# Patient Record
Sex: Female | Born: 1995 | Race: Black or African American | Hispanic: No | Marital: Single | Smoking: Former smoker
Health system: Southern US, Community
[De-identification: ages and names within clinical notes are randomized; demographics above are authoritative.]

## PROBLEM LIST (undated history)

## (undated) DIAGNOSIS — N200 Calculus of kidney: Secondary | ICD-10-CM

## (undated) DIAGNOSIS — F329 Major depressive disorder, single episode, unspecified: Secondary | ICD-10-CM

## (undated) DIAGNOSIS — N63 Unspecified lump in unspecified breast: Secondary | ICD-10-CM

## (undated) DIAGNOSIS — F32A Depression, unspecified: Secondary | ICD-10-CM

## (undated) DIAGNOSIS — F419 Anxiety disorder, unspecified: Secondary | ICD-10-CM

## (undated) HISTORY — PX: WISDOM TOOTH EXTRACTION: SHX21

## (undated) HISTORY — PX: EYE SURGERY: SHX253

## (undated) HISTORY — PX: INDUCED ABORTION: SHX677

## (undated) HISTORY — PX: REFRACTIVE SURGERY: SHX103

---
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
## 2014-01-30 ENCOUNTER — Emergency Department (HOSPITAL_COMMUNITY)
Admission: EM | Admit: 2014-01-30 | Discharge: 2014-01-30 | Disposition: A | Discharge status: Home / Self Care | Payer: Medicaid Other | Attending: Emergency Medicine | Admitting: Emergency Medicine

## 2014-01-30 ENCOUNTER — Encounter (HOSPITAL_COMMUNITY): Payer: Self-pay | Admitting: Emergency Medicine

## 2014-01-30 DIAGNOSIS — F17200 Nicotine dependence, unspecified, uncomplicated: Secondary | ICD-10-CM | POA: Insufficient documentation

## 2014-01-30 DIAGNOSIS — K0889 Other specified disorders of teeth and supporting structures: Secondary | ICD-10-CM

## 2014-01-30 DIAGNOSIS — K089 Disorder of teeth and supporting structures, unspecified: Secondary | ICD-10-CM | POA: Insufficient documentation

## 2014-01-30 DIAGNOSIS — R609 Edema, unspecified: Secondary | ICD-10-CM | POA: Insufficient documentation

## 2014-01-30 MED ORDER — PENICILLIN V POTASSIUM 500 MG PO TABS: 500.0000 mg | ORAL_TABLET | Freq: Four times a day (QID) | ORAL | Status: AC

## 2014-01-30 MED ORDER — IBUPROFEN 800 MG PO TABS: 800.0000 mg | ORAL_TABLET | Freq: Three times a day (TID) | ORAL | Status: DC | PRN

## 2014-01-30 MED ORDER — HYDROCODONE-ACETAMINOPHEN 5-325 MG PO TABS: 1.0000 | ORAL_TABLET | ORAL | Status: DC | PRN

## 2014-01-30 NOTE — Discharge Instructions (Signed)
Read the information below.  Use the prescribed medication as directed.  Do not take additional tylenol while taking the prescribed Vicodin. Please discuss all new medications with your pharmacist.  You may return to the Emergency Department at any time for worsening condition or any new symptoms that concern you.  Please call the dentist listed above within 48 hours to schedule a close follow up appointment.  If you develop fevers, swelling in your face, difficulty swallowing or breathing, return to the ER immediately for a recheck.    Dental Pain A tooth ache may be caused by cavities (tooth decay). Cavities expose the nerve of the tooth to air and hot or cold temperatures. It may come from an infection or abscess (also called a boil or furuncle) around your tooth. It is also often caused by dental caries (tooth decay). This causes the pain you are having. DIAGNOSIS  Your caregiver can diagnose this problem by exam. TREATMENT   If caused by an infection, it may be treated with medications which kill germs (antibiotics) and pain medications as prescribed by your caregiver. Take medications as directed.  Only take over-the-counter or prescription medicines for pain, discomfort, or fever as directed by your caregiver.  Whether the tooth ache today is caused by infection or dental disease, you should see your dentist as soon as possible for further care. SEEK MEDICAL CARE IF: The exam and treatment you received today has been provided on an emergency basis only. This is not a substitute for complete medical or dental care. If your problem worsens or new problems (symptoms) appear, and you are unable to meet with your dentist, call or return to this location. SEEK IMMEDIATE MEDICAL CARE IF:   You have a fever.  You develop redness and swelling of your face, jaw, or neck.  You are unable to open your mouth.  You have severe pain uncontrolled by pain medicine. MAKE SURE YOU:   Understand these  instructions.  Will watch your condition.  Will get help right away if you are not doing well or get worse. Document Released: 08/13/2005 Document Revised: 11/05/2011 Document Reviewed: 03/31/2008 [facility] Patient Information 2014 [place], [place].    Emergency Department Resource Guide 1) Find a Doctor and Pay Out of Pocket Although you won't have to find out who is covered by your insurance plan, it is a good idea to ask around and get recommendations. You will then need to call the office and see if the doctor you have chosen will accept you as a new patient and what types of options they offer for patients who are self-pay. Some doctors offer discounts or will set up payment plans for their patients who do not have insurance, but you will need to ask so you aren't surprised when you get to your appointment.  2) Contact Your Local Health Department Not all health departments have doctors that can see patients for sick visits, but many do, so it is worth a call to see if yours does. If you don't know where your local health department is, you can check in your phone book. The CDC also has a tool to help you locate your state's health department, and many state websites also have listings of all of their local health departments.  3) Find a Walk-in Clinic If your illness is not likely to be very severe or complicated, you may want to try a walk in clinic. These are popping up all over the country in pharmacies, drugstores, and  shopping centers. They're usually staffed by nurse practitioners or physician assistants that have been trained to treat common illnesses and complaints. They're usually fairly quick and inexpensive. However, if you have serious medical issues or chronic medical problems, these are probably not your best option.  No Primary Care Doctor: - Call Health Connect at  [phone] - they can help you locate a primary care doctor that  accepts your insurance, provides certain  services, etc. - Physician Referral Service- [phone]  Chronic Pain Problems: Organization         Address  Phone   Notes  [facility]  [phone] Patients need to be referred by their primary care doctor.   Medication Assistance: Organization         Address  Phone   Notes  [facility] Medication [facility] [address] [place] [address] [place], [place] [address] [phone] --Must be a resident of [facility] -- Must have NO insurance coverage whatsoever (no Medicaid/ Medicare, etc.) -- The pt. MUST have a primary care doctor that directs their care regularly and follows them in the community   MedAssist  [phone]   [employer]  [phone]    Agencies that provide inexpensive medical care: Organization         Address  Phone   Notes  [facility]  [phone]   [facility]    [phone]   [facility] [address] [place], [place] [address] [phone]   [facility] [address] [place] [phone]   Planned Parenthood    [phone]   [facility]    [phone]   Community Health and [facility]  [address], [place] Phone:  [phone], Fax:  [phone] Hours of Operation:  9 am - 6 pm, M-F.  Also accepts Medicaid/Medicare and self-pay.  [facility]  [address], [place] Phone: [phone], Fax: [phone]. Hours of Operation:  8:30 am - 5:30 pm, M-F.  Also accepts Medicaid and self-pay.  [facility] [place] [address] [place] Phone: [phone]   [facility] [address] Natasha Bence [place], [place] [phone], Ext. [phone] Mondays & Thursdays: 7-9 AM.  First 15 patients are seen on a first come, first serve basis.    Medicaid-accepting [facility] Providers:  Organization         Address  Phone   Notes  [facility] [address], [place] [phone] Also accepts self-pay patients.  [facility] [address] Laurell Josephs [place], [place]  [phone]   [facility] [address], [place] [phone]   [facility] [address] [place] [phone]   Renaye Rakers [address], [place]   [phone] Only accepts [place] patients after they have their name applied to their card.   Self-Pay (no insurance) in [facility]:  Organization         Address  Phone   Notes  Sickle Cell Patients, [facility] [address] [place], [place] [phone]   [facility] [address] [place], [place] [phone]   [facility] [place]  [address], [place] [phone]  [facility]/Dr. Osei-Bonsu  [address] [place] [address], [place] or [address], [place] [phone] Phone number for both [place] and [place] locations is the same.  [facility] [address], [place] [phone]) [phone]   [facility] [address] [place] [address], [place] or [address] [phone]) [phone]   [facility] [address], [place] [phone], phone; [phone], fax Sees patients 1st and 3rd Saturday of every month.  Must not qualify for public or private insurance (i.e. Medicaid, Medicare, [place] Health Choice, Veterans' Benefits)  Household income should be no more than 200% of the poverty level The clinic cannot treat you if you are pregnant or think you are pregnant  Sexually transmitted diseases are not treated at the clinic.    Dental Care: Organization         Address  Phone  Notes  [facility] [address], [place] [phone] Accepts  children up to age 19 who are enrolled in [place]icaid or [place] Health Choice; pregnant women with a Medicaid card; and children who have applied for Medicaid or [place] Health Choice, but were declined, whose parents can pay a reduced fee at time of service.  [facility] [place]  [address], [place] [phone] Accepts children up to age 19 who are enrolled in [place] or [place] Health Choice; pregnant women with a Medicaid card; and children who have applied for Medicaid or [place] Health Choice, but were declined, whose parents can pay a reduced fee at time of service.  [facility]  [address], [place] [phone]) [phone] Patients are seen by appointment only. Walk-ins are not accepted. [facility] will see patients 19 years of age and older. Monday - Tuesday (8am-5pm) Most Wednesdays (8:30-5pm) $30 per visit, cash only  [facility]  [address], [facility] [phone] Patients are seen by appointment only. Walk-ins are not accepted. [facility] will see patients 19 years of age and older. One Wednesday Evening (Monthly: Volunteer Based).  $30 per visit, cash only  [employer] [facility]  [phone] for adults; Children under age 704, call Graduate Pediatric Dentistry at [phone]. Children aged 574-14, please call [phone] to request a pediatric application.  Dental services are provided in all areas of dental care including fillings, crowns and bridges, complete and partial dentures, implants, gum treatment, root canals, and extractions. Preventive care is also provided. Treatment is provided to both adults and children. Patients are selected via a lottery and there is often a waiting list.   [facility] [address], [place]  [phone]) [phone] [email or web address]   [facility] [address], [place], [place] [phone], Ext. [phone] Second and  Fourth Thursday of each month, opens at 6:30 AM; Clinic ends at 9 AM.  Patients are seen on a first-come first-served basis, and a limited number are seen during each clinic.   [facility]  [address], [place], [place] [phone]) [phone]   Eligibility Requirements You must have lived in [place], [place], or [place] counties for at least the last three months.   You cannot be eligible for state or federal sponsored National Cityhealthcare insurance, including CIGNAVeterans Administration, [place]icaid, or [employer]Medicare.   You generally cannot be eligible for healthcare insurance  through your employer.    How to apply: Eligibility screenings are held every Tuesday and Wednesday afternoon from 1:00 pm until 4:00 pm. You do not need an appointment for the interview!  [facility] [address], [place], [place] [phone]   [facility]  [phone]   [facility]  [phone]   [facility]  [phone]    Behavioral Health Resources in the Community: Intensive Outpatient Programs Organization         Address  Phone  Notes  [facility] [address], [place], [place] [phone]   [facility] [address], [place], [place] [phone]   [facility] [address], [place], [place]  [phone]   [facility] [address],  [place], [place] [phone] or [phone]   Substance Abuse Resources Organization         Address  Phone  Notes  [facility]  [phone]   [facility]  [phone]   [facility]  [phone]   [facility]  [phone]   Residential & Outpatient Substance Abuse Program  [phone]   Psychological Services Organization         Address  Phone  Notes  [facility]  [phone]- [phone]   [facility]  [phone]- [phone]   [facility]  [address], [place] [phone] or [phone]    Mobile Crisis Teams Organization         Address  Phone  Notes  [facility], Mobile Crisis Care Unit  [phone]   [facility]  [address] [place], [place] [phone]   Doristine LocksSharon DeEsch [address] [place] [phone]    Self-Help/Support Groups Organization         Address  Phone             Notes  [facility] [place] - variety of support groups  336- [phone] Call for more information  Narcotics Anonymous (NA), [facility] [address], [place]  2 meetings at this location   Statisticianesidential Treatment Programs Organization         Address  Phone  Notes  [facility] [address],    [place]  [phone]   [facility]  [address], [place] [phone], [place], [place] [phone]   [facility] [address], [place] [phone] Admissions: 8am-3pm M-F  [facility] [address]    [place], [place] [phone]   [facility] [address], [place], [place] [phone]   [facility] [address]  [place], [place] [phone]   [facility] - Intensive Outpatient [address], [place], [place] [phone]   [facility] ([facility] [address]  [place], [place] [phone] or [phone]   [facility] ([facility]) [address] [place], [place] [phone] Accepts Medicaid  [facility] [address]  [place] [phone] Substance Abuse/Addiction Treatment   [facility] Resources Organization         Address  Phone  Notes  [facility]  [phone]   Angie FavaJulie Brannon, PhD [address] [place], [place]   [phone] or [phone]) [phone]   [facility]   [address] [place], [place] [phone])  [phone]  [facility] [address], [place], [place] [phone] Insurance/Medicaid/sponsorship through [facility]  °[facility] [address]                                    [place], [place] [phone] Therapy/tele-psych/case  °[facility] [address]  ° [place], [place] [phone]    °Dr. Arfeen  [phone]   °[facility] [place]  [employer] [place] [facility] 1) [address], [place] °2) [address], [place] °3)  [address], [place] [phone] °([phone] ° °([phone]   °[place] Child Abuse Hotline (336) 342-1394 or (336) [phone] (After Hours)    ° ° ° °

## 2014-01-30 NOTE — ED Notes (Signed)
PT reports mouth swelling started on WED. And has increased in size. Pt denies any tooth injury.

## 2014-01-30 NOTE — ED Provider Notes (Signed)
Medical screening examination/treatment/procedure(s) were performed by non-physician practitioner and as supervising physician I was immediately available for consultation/collaboration.     Suzi Roots, MD 01/30/14 [phone]

## 2014-01-30 NOTE — ED Notes (Signed)
PT declined W/C at time of D/C the patient escorted to lobby by RN.

## 2014-01-30 NOTE — ED Provider Notes (Signed)
CSN: [id number]     Arrival date & time 01/30/14  0840 History  This chart was scribed for Clayton Bibles PA-C working with Mirna Mires, MD by Stacy Gardner, ED scribe. This patient was seen in room TR07C/TR07C and the patient's care was started at 9:17 AM.   First MD Initiated Contact with Patient 01/30/14 0845     Chief Complaint  Patient presents with  . Mouth Lesions     (Consider location/radiation/quality/duration/timing/severity/associated sxs/prior Treatment) Patient is a 18 y.o. female presenting with tooth pain. The history is provided by the patient and medical records. No language interpreter was used.  Dental Pain Location:  Upper Quality:  Sharp and constant Severity:  Severe Onset quality:  Gradual Duration:  3 days Timing:  Constant Progression:  Worsening Worsened by:  Cold food/drink and hot food/drink Associated symptoms: facial swelling   Associated symptoms: no fever    HPI Comments: Samantha Carroll is a 18 y.o. female who presents to the Emergency Department complaining of constant, severe lower right jaw pain onset three days ago that is getting progressively worse. The pain is 9/10 in severity. She describes the pain as stabbing and aching.  The pain is worse with eating, drinking and swallowing. She has tried [facility] powder and ibuprofen without any improvement of her symptoms. Pt reports having mild facial swelling.  Denies fever, ear pain, and difficulty swallowing.  Denies trauma and injury. Pt speculates the presence of a cavity or her wisdom tooth.  She currently smokes cigarettes everyday.    Pt does not have a regular dentist.   History reviewed. No pertinent past medical history. History reviewed. No pertinent past surgical history. History reviewed. No pertinent family history. History  Substance Use Topics  . Smoking status: Current Every Day Smoker    Types: Cigarettes  . Smokeless tobacco: Never Used  . Alcohol Use: No   OB History   Grav  Para Term Preterm Abortions TAB SAB Ect Mult Living                 Review of Systems  Constitutional: Negative for fever.  HENT: Positive for dental problem and facial swelling. Negative for ear pain and trouble swallowing.        Right jaw pain  All other systems reviewed and are negative.     Allergies  Review of patient's allergies indicates no known allergies.  Home Medications   Prior to Admission medications   Medication Sig Start Date End Date Taking? Authorizing Provider  ibuprofen (ADVIL,MOTRIN) 600 MG tablet Take 600 mg by mouth every 6 (six) hours as needed.      Historical Provider, MD  oseltamivir (TAMIFLU) 75 MG capsule Take 1 capsule (75 mg total) by mouth every 12 (twelve) hours. 08/11/12   Billy Fischer, MD  PredniSONE 5 MG KIT Take 1 kit (5 mg total) by mouth daily after breakfast. Prednisone 5 mg 6 day dosepack.  Take as directed. 07/09/12   Harden Mo, MD  triamcinolone cream (KENALOG) 0.1 % Apply topically 3 (three) times daily. 07/09/12   Harden Mo, MD   BP 117/72  Pulse 85  Temp(Src) 98.1 F (36.7 C) (Oral)  Resp 18  Ht 5' 8"  (1.727 m)  Wt 157 lb 5 oz (71.356 kg)  BMI 23.92 kg/m2  SpO2 99%  LMP 01/30/2014 Physical Exam  Nursing note and vitals reviewed. Constitutional: She appears well-developed and well-nourished. No distress.  HENT:  Head: Normocephalic and atraumatic.  Mouth/Throat:  Oropharynx is clear and moist and mucous membranes are normal. No posterior oropharyngeal edema or posterior oropharyngeal erythema.  Right lower third molar is tender to percussion. White plaque surrounding tooth that is tender to palpation.  Mild right-sided facial fullness and tenderness to palpation.    Neck: Neck supple.  No paratracheal tenderness.   Cardiovascular: Normal rate, regular rhythm and normal heart sounds.  Exam reveals no gallop and no friction rub.   No murmur heard. Pulmonary/Chest: Effort normal and breath sounds normal. She has no  wheezes. She has no rales.  Lymphadenopathy:    She has no cervical adenopathy.  Neurological: She is alert.  Skin: Skin is warm and dry. She is not diaphoretic.    ED Course  Procedures (including critical care time) DIAGNOSTIC STUDIES: Oxygen Saturation is 99% on room air, normal by my interpretation.    COORDINATION OF CARE:  9:20 AM Discussed course of care with pt which includes antibiotics. Will give pt a referral to a dentist. Advised pt to follow up with a dentist.Pt understands and agrees.      Labs Review Labs Reviewed - No data to display  Imaging Review No results found.   EKG Interpretation None      MDM   Final diagnoses:  Pain, dental    Afebrile, nontoxic patient with new dental pain.  No obvious abscess.  No concerning findings on exam.  Doubt deep space head or neck infection.  Doubt Ludwig's angina.  D/C home with antibiotic, pain medication and dental follow up.  Discussed findings, treatment, and follow up  with patient.  Pt given return precautions.  Pt verbalizes understanding and agrees with plan.      I personally performed the services described in this documentation, which was scribed in my presence. The recorded information has been reviewed and is accurate.   [place], PA-C 01/30/14 [phone]

## 2014-03-16 ENCOUNTER — Emergency Department (HOSPITAL_COMMUNITY)
Admission: EM | Admit: 2014-03-16 | Discharge: 2014-03-17 | Disposition: A | Discharge status: Home / Self Care | Payer: Medicaid Other | Attending: Emergency Medicine | Admitting: Emergency Medicine

## 2014-03-16 ENCOUNTER — Emergency Department (HOSPITAL_COMMUNITY): Admission: EM | Admit: 2014-03-16 | Discharge: 2014-03-16 | Discharge status: Against Medical Advice | Payer: Self-pay

## 2014-03-16 ENCOUNTER — Encounter (HOSPITAL_COMMUNITY): Payer: Self-pay | Admitting: Emergency Medicine

## 2014-03-16 DIAGNOSIS — T398X2A Poisoning by other nonopioid analgesics and antipyretics, not elsewhere classified, intentional self-harm, initial encounter: Secondary | ICD-10-CM | POA: Diagnosis not present

## 2014-03-16 DIAGNOSIS — F53 Postpartum depression: Secondary | ICD-10-CM | POA: Diagnosis present

## 2014-03-16 DIAGNOSIS — F17200 Nicotine dependence, unspecified, uncomplicated: Secondary | ICD-10-CM | POA: Diagnosis not present

## 2014-03-16 DIAGNOSIS — F4321 Adjustment disorder with depressed mood: Secondary | ICD-10-CM | POA: Diagnosis present

## 2014-03-16 DIAGNOSIS — F329 Major depressive disorder, single episode, unspecified: Secondary | ICD-10-CM | POA: Diagnosis not present

## 2014-03-16 DIAGNOSIS — T50902A Poisoning by unspecified drugs, medicaments and biological substances, intentional self-harm, initial encounter: Secondary | ICD-10-CM

## 2014-03-16 DIAGNOSIS — T39314A Poisoning by propionic acid derivatives, undetermined, initial encounter: Secondary | ICD-10-CM | POA: Insufficient documentation

## 2014-03-16 DIAGNOSIS — R45851 Suicidal ideations: Secondary | ICD-10-CM | POA: Insufficient documentation

## 2014-03-16 DIAGNOSIS — Z3202 Encounter for pregnancy test, result negative: Secondary | ICD-10-CM | POA: Diagnosis not present

## 2014-03-16 DIAGNOSIS — F3289 Other specified depressive episodes: Secondary | ICD-10-CM | POA: Diagnosis not present

## 2014-03-16 DIAGNOSIS — R1013 Epigastric pain: Secondary | ICD-10-CM | POA: Diagnosis not present

## 2014-03-16 DIAGNOSIS — T394X2A Poisoning by antirheumatics, not elsewhere classified, intentional self-harm, initial encounter: Secondary | ICD-10-CM | POA: Insufficient documentation

## 2014-03-16 LAB — RAPID URINE DRUG SCREEN, HOSP PERFORMED
Amphetamines: NOT DETECTED
Barbiturates: NOT DETECTED
Benzodiazepines: NOT DETECTED
COCAINE: NOT DETECTED
Opiates: NOT DETECTED
Tetrahydrocannabinol: POSITIVE — AB

## 2014-03-16 LAB — BLOOD GAS, VENOUS
Acid-base deficit: 0.8 mmol/L (ref 0.0–2.0)
Bicarbonate: 24.6 mEq/L — ABNORMAL HIGH (ref 20.0–24.0)
FIO2: 0.21 %
O2 Saturation: 21.9 %
PATIENT TEMPERATURE: 98.6
TCO2: 22.6 mmol/L (ref 0–100)
pCO2, Ven: 45.5 mmHg (ref 45.0–50.0)
pH, Ven: 7.351 — ABNORMAL HIGH (ref 7.250–7.300)

## 2014-03-16 LAB — BLOOD GAS, ARTERIAL
ACID-BASE DEFICIT: 2.3 mmol/L — AB (ref 0.0–2.0)
Bicarbonate: 18.9 mEq/L — ABNORMAL LOW (ref 20.0–24.0)
DRAWN BY: 31814
FIO2: 0.36 %
O2 SAT: 99.2 %
PATIENT TEMPERATURE: 98.6
TCO2: 16.4 mmol/L (ref 0–100)
pCO2 arterial: 24 mmHg — ABNORMAL LOW (ref 35.0–45.0)
pH, Arterial: 7.507 — ABNORMAL HIGH (ref 7.350–7.450)
pO2, Arterial: 165 mmHg — ABNORMAL HIGH (ref 80.0–100.0)

## 2014-03-16 LAB — COMPREHENSIVE METABOLIC PANEL
ALK PHOS: 90 U/L (ref 39–117)
ALT: 10 U/L (ref 0–35)
AST: 18 U/L (ref 0–37)
Albumin: 4.3 g/dL (ref 3.5–5.2)
Anion gap: 12 (ref 5–15)
BILIRUBIN TOTAL: 0.3 mg/dL (ref 0.3–1.2)
BUN: 10 mg/dL (ref 6–23)
CALCIUM: 9.4 mg/dL (ref 8.4–10.5)
CHLORIDE: 105 meq/L (ref 96–112)
CO2: 23 mEq/L (ref 19–32)
Creatinine, Ser: 0.84 mg/dL (ref 0.50–1.10)
GFR calc non Af Amer: >90 mL/min (ref >90)
Glucose, Bld: 92 mg/dL (ref 70–99)
POTASSIUM: 4.6 meq/L (ref 3.7–5.3)
Sodium: 140 mEq/L (ref 137–147)
TOTAL PROTEIN: 7.3 g/dL (ref 6.0–8.3)

## 2014-03-16 LAB — CBC
HEMATOCRIT: 37.9 % (ref 36.0–46.0)
HEMOGLOBIN: 12.8 g/dL (ref 12.0–15.0)
MCH: 29.9 pg (ref 26.0–34.0)
MCHC: 33.8 g/dL (ref 30.0–36.0)
MCV: 88.6 fL (ref 78.0–100.0)
Platelets: 308 10*3/uL (ref 150–400)
RBC: 4.28 MIL/uL (ref 3.87–5.11)
RDW: 12.8 % (ref 11.5–15.5)
WBC: 6 10*3/uL (ref 4.0–10.5)

## 2014-03-16 LAB — ACETAMINOPHEN LEVEL: Acetaminophen (Tylenol), Serum: <15 ug/mL (ref 10–30)

## 2014-03-16 LAB — ETHANOL

## 2014-03-16 LAB — POC URINE PREG, ED: Preg Test, Ur: NEGATIVE

## 2014-03-16 LAB — CBG MONITORING, ED: GLUCOSE-CAPILLARY: 89 mg/dL (ref 70–99)

## 2014-03-16 LAB — SALICYLATE LEVEL

## 2014-03-16 MED ORDER — ONDANSETRON HCL 4 MG PO TABS: 4.0000 mg | ORAL_TABLET | Freq: Three times a day (TID) | ORAL | Status: DC | PRN

## 2014-03-16 MED ORDER — ZOLPIDEM TARTRATE 5 MG PO TABS
5.0000 mg | ORAL_TABLET | Freq: Every evening | ORAL | Status: DC | PRN
  Administered 2014-03-16: 5 mg via ORAL
  Filled 2014-03-16: qty 1

## 2014-03-16 MED ORDER — NICOTINE 21 MG/24HR TD PT24
21.0000 mg | MEDICATED_PATCH | Freq: Every day | TRANSDERMAL | Status: DC
  Administered 2014-03-16 – 2014-03-17 (×2): 21 mg via TRANSDERMAL
  Filled 2014-03-16 (×2): qty 1

## 2014-03-16 MED ORDER — ALUM & MAG HYDROXIDE-SIMETH 200-200-20 MG/5ML PO SUSP: 30.0000 mL | ORAL | Status: DC | PRN

## 2014-03-16 MED ORDER — LORAZEPAM 1 MG PO TABS
1.0000 mg | ORAL_TABLET | Freq: Three times a day (TID) | ORAL | Status: DC | PRN
  Filled 2014-03-16: qty 1

## 2014-03-16 MED ORDER — PANTOPRAZOLE SODIUM 40 MG PO TBEC
40.0000 mg | DELAYED_RELEASE_TABLET | Freq: Every day | ORAL | Status: DC
  Administered 2014-03-16 – 2014-03-17 (×2): 40 mg via ORAL
  Filled 2014-03-16 (×2): qty 1

## 2014-03-16 MED ORDER — IBUPROFEN 200 MG PO TABS: 600.0000 mg | ORAL_TABLET | Freq: Three times a day (TID) | ORAL | Status: DC | PRN

## 2014-03-16 NOTE — Progress Notes (Signed)
  CARE MANAGEMENT ED NOTE 03/16/2014  Patient:  Alisa GraffLEXANDER,JASMINE   Account Number:  [id number]  Date Initiated:  03/16/2014  Documentation initiated by:  Radford PaxFERRERO,Deeana Atwater  Subjective/Objective Assessment:   Patient presents to Ed with OD of ibuprofen. Patient reports she is no longer SI     Subjective/Objective Assessment Detail:     Action/Plan:   Action/Plan Detail:   Anticipated DC Date:       Status Recommendation to Physician:   Result of Recommendation:    Other ED Services  Consult Working Plan    DC Planning Services  Other  PCP issues    Choice offered to / List presented to:            Status of service:  Completed, signed off  ED Comments:   ED Comments Detail:  EDCM spoke to patient at bedside.  Patient reports she does not know if she has a pcp or not and cannot remember if there is one listed on her Medicaid card.  Per chart review, pcp listed on Medicaid card from 2013 is [facility].  EDCM providd patient with address and phone number for [facility].  EDCM also provided patient with list of pcps who [place].  Instructed patient is she wanted to change pcp to call the DSS so that they may provide her with a new card.  Patient thankful for resources.  No further EDCm needs at this time.

## 2014-03-16 NOTE — ED Notes (Signed)
Samantha Carroll with Poison control called for pt update. Case is closed with poison control.
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
## 2014-03-16 NOTE — ED Notes (Signed)
Patient denies SI, HI, AVH. Rates feelings of depression 8/10. Reports that finances and relationship problems have increased depression recently. States that she has been facing depression for a couple of years.  Encouragement offered.   Q 15 checks in place.
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
## 2014-03-16 NOTE — ED Notes (Signed)
Patient created code word "[id number]" when receiving calls.
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
## 2014-03-16 NOTE — ED Notes (Signed)
Pt wanded by security. Belongings taken home by brother, including cell phone. Pt did have silver colored ring and a half-heart necklace that was silver in color. Both items were placed in a specimen cup, labeled, and given to psych ed staff.

## 2014-03-16 NOTE — ED Provider Notes (Signed)
Medical screening examination/treatment/procedure(s) were conducted as a shared visit with non-physician practitioner(s) and myself.  I personally evaluated the patient during the encounter.   EKG Interpretation None     Patient seen examined. Admits to suicide attempt. Patient to be medically cleared and seen by behavior health services  Toy BakerAnthony T Derek Laughter, MD 03/16/14 [phone]

## 2014-03-16 NOTE — ED Notes (Signed)
Bed: WA02 Expected date: 03/16/14 Expected time: 2:04 PM Means of arrival:  Comments: ems overdose

## 2014-03-17 ENCOUNTER — Encounter (HOSPITAL_COMMUNITY): Payer: Self-pay | Admitting: Psychiatry

## 2014-03-17 DIAGNOSIS — F4321 Adjustment disorder with depressed mood: Secondary | ICD-10-CM

## 2014-03-17 DIAGNOSIS — F53 Postpartum depression: Secondary | ICD-10-CM | POA: Diagnosis present

## 2014-03-17 NOTE — Progress Notes (Signed)
Pt was given [facility] Resource packet, Stress Interview worksheet, Crisis Plan worksheet and Coping Skills worksheet. Pt was explained the purpose of the material. Pt appeared very receptive of material and pt began looking through information. Pt had no questions for this Clinical research associatewriter when asked.

## 2014-03-17 NOTE — ED Provider Notes (Signed)
Medical screening examination/treatment/procedure(s) were conducted as a shared visit with non-physician practitioner(s) and myself.  I personally evaluated the patient during the encounter.   EKG Interpretation None       Toy BakerAnthony T Quinnton Bury, MD 03/17/14 [phone]
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
## 2014-03-17 NOTE — Consult Note (Signed)
Face to face evaluation and I agree with this note 
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
## 2014-03-17 NOTE — BHH Counselor (Signed)
[facility]  [facility]  [facility]  [facility] Clista BernhardtNancy Alexus Michael, [facility] Triage Specialist 03/17/2014 12:52 AM

## 2014-03-17 NOTE — B[facility] Suicide Risk Assessment (Signed)
Suicide Risk Assessment  Discharge Assessment     Demographic Factors:  Adolescent or young adult  Total Time spent with patient: 20 minutes  Psychiatric Specialty Exam:     Blood pressure 116/67, pulse 87, temperature 97.6 F (36.4 C), temperature source Oral, resp. rate 18, last menstrual period 03/16/2014, SpO2 100.00%.There is no weight on file to calculate BMI.  General Appearance: Casual  Eye Contact::  Good  Speech:  Normal Rate  Volume:  Normal  Mood:  mildly depressed  Affect:  Congruent  Thought Process:  Coherent  Orientation:  Full (Time, Place, and Person)  Thought Content:  WDL  Suicidal Thoughts:  No  Homicidal Thoughts:  No  Memory:  Immediate;   Good Recent;   Good Remote;   Good  Judgement:  Fair  Insight:  Fair  Psychomotor Activity:  Normal  Concentration:  Good  Recall:  Good  Fund of Knowledge:Good  Language: Good  Akathisia:  No  Handed:  Right  AIMS (if indicated):     Assets:  Desire for Improvement Financial Resources/Insurance Housing Leisure Time Physical Health Resilience Social Support Transportation Vocational/Educational  Sleep:      Musculoskeletal: Strength & Muscle Tone: within normal limits Gait & Station: normal Patient leans: N/A  Mental Status Per Nursing Assessment::   On Admission:   Overdose, impulsive  Current Mental Status by Physician: NA  Loss Factors: NA  Historical Factors: NA  Risk Reduction Factors:   Sense of responsibility to family, Employed, Living with another person, especially a relative, Positive social support and Positive coping skills or problem solving skills  Continued Clinical Symptoms:  Mild depression  Cognitive Features That Contribute To Risk:  None  Suicide Risk:  Minimal: No identifiable suicidal ideation.  Patients presenting with no risk factors but with morbid ruminations; may be classified as minimal risk based on the severity of the depressive symptoms  Discharge  Diagnoses:   AXIS I:  Adjustment Disorder with Depressed Mood AXIS II:  Deferred AXIS III:  History reviewed. No pertinent past medical history. AXIS IV:  other psychosocial or environmental problems, problems related to social environment and problems with primary support group AXIS V:  61-70 mild symptoms  Plan Of Care/Follow-up recommendations:  Activity:  as tolerated Diet:  low-sodium heart healthy diet  Is patient on multiple antipsychotic therapies at discharge:  No   Has Patient had three or more failed trials of antipsychotic monotherapy by history:  No  Recommended Plan for Multiple Antipsychotic Therapies: NA    Stephfon Bovey, PMH-NP 03/17/2014, 10:57 AM

## 2014-09-30 ENCOUNTER — Encounter (HOSPITAL_COMMUNITY): Payer: Self-pay | Admitting: Emergency Medicine

## 2014-09-30 ENCOUNTER — Emergency Department (HOSPITAL_COMMUNITY)
Admission: EM | Admit: 2014-09-30 | Discharge: 2014-09-30 | Disposition: A | Discharge status: Home / Self Care | Payer: Medicaid Other | Attending: Emergency Medicine | Admitting: Emergency Medicine

## 2014-09-30 DIAGNOSIS — S79912A Unspecified injury of left hip, initial encounter: Secondary | ICD-10-CM | POA: Diagnosis present

## 2014-09-30 DIAGNOSIS — Y998 Other external cause status: Secondary | ICD-10-CM | POA: Insufficient documentation

## 2014-09-30 DIAGNOSIS — S59902A Unspecified injury of left elbow, initial encounter: Secondary | ICD-10-CM | POA: Diagnosis not present

## 2014-09-30 DIAGNOSIS — Y92410 Unspecified street and highway as the place of occurrence of the external cause: Secondary | ICD-10-CM | POA: Diagnosis not present

## 2014-09-30 DIAGNOSIS — Y9389 Activity, other specified: Secondary | ICD-10-CM | POA: Diagnosis not present

## 2014-09-30 DIAGNOSIS — Z720 Tobacco use: Secondary | ICD-10-CM | POA: Diagnosis not present

## 2014-09-30 DIAGNOSIS — Z041 Encounter for examination and observation following transport accident: Secondary | ICD-10-CM

## 2014-09-30 MED ORDER — IBUPROFEN 800 MG PO TABS: 800.0000 mg | ORAL_TABLET | Freq: Three times a day (TID) | ORAL | Status: DC

## 2014-09-30 MED ORDER — METHOCARBAMOL 500 MG PO TABS: 500.0000 mg | ORAL_TABLET | Freq: Two times a day (BID) | ORAL | Status: DC

## 2014-09-30 MED ORDER — IBUPROFEN 400 MG PO TABS
800.0000 mg | ORAL_TABLET | Freq: Once | ORAL | Status: AC
  Administered 2014-09-30: 800 mg via ORAL
  Filled 2014-09-30: qty 2

## 2014-09-30 NOTE — Discharge Instructions (Signed)
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
## 2014-09-30 NOTE — ED Notes (Signed)
EDP at bedside  

## 2014-10-01 ENCOUNTER — Encounter (HOSPITAL_COMMUNITY): Payer: Self-pay | Admitting: Emergency Medicine

## 2014-10-01 ENCOUNTER — Emergency Department (HOSPITAL_COMMUNITY): Payer: Medicaid Other

## 2014-10-01 ENCOUNTER — Emergency Department (HOSPITAL_COMMUNITY)
Admission: EM | Admit: 2014-10-01 | Discharge: 2014-10-01 | Disposition: A | Discharge status: Home / Self Care | Payer: Medicaid Other | Attending: Emergency Medicine | Admitting: Emergency Medicine

## 2014-10-01 DIAGNOSIS — M25511 Pain in right shoulder: Secondary | ICD-10-CM | POA: Diagnosis not present

## 2014-10-01 DIAGNOSIS — Z720 Tobacco use: Secondary | ICD-10-CM | POA: Diagnosis not present

## 2014-10-01 DIAGNOSIS — M25559 Pain in unspecified hip: Secondary | ICD-10-CM

## 2014-10-01 DIAGNOSIS — G8911 Acute pain due to trauma: Secondary | ICD-10-CM | POA: Insufficient documentation

## 2014-10-01 DIAGNOSIS — M25552 Pain in left hip: Secondary | ICD-10-CM | POA: Insufficient documentation

## 2014-10-01 MED ORDER — NAPROXEN 500 MG PO TABS: 500.0000 mg | ORAL_TABLET | Freq: Two times a day (BID) | ORAL | Status: DC

## 2014-10-01 NOTE — ED Notes (Signed)
No changes. To xray via stretcher. Alert, NAD, calm, interactive.
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
## 2014-10-01 NOTE — Discharge Instructions (Signed)

## 2014-10-01 NOTE — ED Notes (Signed)
[facility]
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
## 2014-10-01 NOTE — ED Notes (Signed)
EDNP at [facility] 
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
## 2014-12-27 ENCOUNTER — Inpatient Hospital Stay (HOSPITAL_COMMUNITY)
Admission: AD | Admit: 2014-12-27 | Discharge: 2014-12-27 | Disposition: A | Discharge status: Home / Self Care | Payer: Medicaid Other | Source: Ambulatory Visit | Attending: Obstetrics and Gynecology | Admitting: Obstetrics and Gynecology

## 2014-12-27 ENCOUNTER — Encounter (HOSPITAL_COMMUNITY): Payer: Self-pay | Admitting: *Deleted

## 2014-12-27 DIAGNOSIS — N939 Abnormal uterine and vaginal bleeding, unspecified: Secondary | ICD-10-CM | POA: Insufficient documentation

## 2014-12-27 DIAGNOSIS — F17210 Nicotine dependence, cigarettes, uncomplicated: Secondary | ICD-10-CM | POA: Insufficient documentation

## 2014-12-27 DIAGNOSIS — R102 Pelvic and perineal pain: Secondary | ICD-10-CM | POA: Diagnosis not present

## 2014-12-27 LAB — URINALYSIS, ROUTINE W REFLEX MICROSCOPIC
Bilirubin Urine: NEGATIVE
GLUCOSE, UA: NEGATIVE mg/dL
Hgb urine dipstick: NEGATIVE
KETONES UR: NEGATIVE mg/dL
Leukocytes, UA: NEGATIVE
NITRITE: NEGATIVE
Protein, ur: NEGATIVE mg/dL
Specific Gravity, Urine: 1.02 (ref 1.005–1.030)
Urobilinogen, UA: 0.2 mg/dL (ref 0.0–1.0)
pH: 6.5 (ref 5.0–8.0)

## 2014-12-27 LAB — POCT PREGNANCY, URINE: PREG TEST UR: NEGATIVE

## 2014-12-27 NOTE — MAU Provider Note (Signed)
  History     CSN: [id number]  Arrival date and time: 12/27/14 [id number]   First Provider Initiated Contact with Patient 12/27/14 0130      Chief Complaint  Patient presents with  . Possible Pregnancy  . Abdominal Pain   HPI  Pt is a 19 y.o. yo female here with report of left-sided pelvic pain x one month.  Pain is intermittent in nature.  Denies abnormal discharge.  Patient's last menstrual period was 11/08/2014.  Currently using Nexplanon for family planning.  Reports spotting for 1-2 days every month.     History reviewed. No pertinent past medical history.  History reviewed. No pertinent past surgical history.  History reviewed. No pertinent family history.  History  Substance Use Topics  . Smoking status: Current Every Day Smoker  . Smokeless tobacco: Never Used  . Alcohol Use: No    Allergies: No Known Allergies  No prescriptions prior to admission    Review of Systems  Constitutional: Negative for fever and chills.  Gastrointestinal: Positive for abdominal pain. Negative for nausea, vomiting, diarrhea and constipation.  Genitourinary: Negative for dysuria, urgency and frequency.       Negative vaginal discharge   Physical Exam   Blood pressure 124/72, pulse 79, temperature 98.4 F (36.9 C), temperature source Oral, resp. rate 16, height 5\' 8"  (1.727 m), weight 72.576 kg (160 lb), last menstrual period 11/08/2014, SpO2 100 %.  Physical Exam  Constitutional: She is oriented to person, place, and time. She appears well-developed and well-nourished. No distress.  HENT:  Head: Normocephalic.  Eyes: Pupils are equal, round, and reactive to light.  Neck: Normal range of motion. Neck supple.  Cardiovascular: Normal rate, regular rhythm and normal heart sounds.   Respiratory: Effort normal and breath sounds normal.  GI: Soft. She exhibits no mass. There is no guarding.  Genitourinary:  Negative cervical motion tenderness  Neurological: She is alert and oriented to  person, place, and time. She has normal reflexes.  Skin: Skin is warm and dry.    MAU Course  Procedures Results for orders placed or performed during the hospital encounter of 12/27/14 (from the past 24 hour(s))  Urinalysis, Routine w reflex microscopic     Status: None   Collection Time: 12/27/14 12:58 AM  Result Value Ref Range   Color, Urine YELLOW YELLOW   APPearance CLEAR CLEAR   Specific Gravity, Urine 1.020 1.005 - 1.030   pH 6.5 5.0 - 8.0   Glucose, UA NEGATIVE NEGATIVE mg/dL   Hgb urine dipstick NEGATIVE NEGATIVE   Bilirubin Urine NEGATIVE NEGATIVE   Ketones, ur NEGATIVE NEGATIVE mg/dL   Protein, ur NEGATIVE NEGATIVE mg/dL   Urobilinogen, UA 0.2 0.0 - 1.0 mg/dL   Nitrite NEGATIVE NEGATIVE   Leukocytes, UA NEGATIVE NEGATIVE    Pregnancy test - negative  Assessment and Plan  Pelvic Pain - normal exam Irregular Bleeding - Nexplanon related  Plan: Discharge to home Explained side effects of Nexplanon and irregular bleeding Report worsening or no improvement in symptoms OTC Ibuprofen for pain  Marlis EdelsonWalidah N Karim, CNM
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
## 2015-04-01 ENCOUNTER — Encounter (HOSPITAL_COMMUNITY): Payer: Self-pay | Admitting: Emergency Medicine

## 2015-07-31 ENCOUNTER — Emergency Department (HOSPITAL_COMMUNITY)
Admission: EM | Admit: 2015-07-31 | Discharge: 2015-07-31 | Disposition: A | Discharge status: Home / Self Care | Payer: Medicaid Other | Attending: Emergency Medicine | Admitting: Emergency Medicine

## 2015-07-31 ENCOUNTER — Emergency Department (HOSPITAL_COMMUNITY): Payer: Medicaid Other

## 2015-07-31 ENCOUNTER — Encounter (HOSPITAL_COMMUNITY): Payer: Self-pay | Admitting: Emergency Medicine

## 2015-07-31 DIAGNOSIS — Z791 Long term (current) use of non-steroidal anti-inflammatories (NSAID): Secondary | ICD-10-CM | POA: Insufficient documentation

## 2015-07-31 DIAGNOSIS — W228XXA Striking against or struck by other objects, initial encounter: Secondary | ICD-10-CM | POA: Insufficient documentation

## 2015-07-31 DIAGNOSIS — F17210 Nicotine dependence, cigarettes, uncomplicated: Secondary | ICD-10-CM | POA: Insufficient documentation

## 2015-07-31 DIAGNOSIS — Y9389 Activity, other specified: Secondary | ICD-10-CM | POA: Insufficient documentation

## 2015-07-31 DIAGNOSIS — Y998 Other external cause status: Secondary | ICD-10-CM | POA: Insufficient documentation

## 2015-07-31 DIAGNOSIS — Y9289 Other specified places as the place of occurrence of the external cause: Secondary | ICD-10-CM | POA: Insufficient documentation

## 2015-07-31 DIAGNOSIS — S6991XA Unspecified injury of right wrist, hand and finger(s), initial encounter: Secondary | ICD-10-CM | POA: Insufficient documentation

## 2015-07-31 DIAGNOSIS — Z79899 Other long term (current) drug therapy: Secondary | ICD-10-CM | POA: Insufficient documentation

## 2015-07-31 NOTE — ED Provider Notes (Signed)
CSN: [id number]     Arrival date & time 07/31/15  1957 History   First MD Initiated Contact with Patient 07/31/15 2003     Chief Complaint  Patient presents with  . Hand Pain     (Consider location/radiation/quality/duration/timing/severity/associated sxs/prior Treatment) The history is provided by the patient and medical records.     19 year old female with no past medical history presenting to the ED for right hand pain. Patient states she got angry earlier today and punched a door. She states since this time she has had progressive swelling of dorsal right hand. She denies numbness or weakness of her right hand. She is right-hand dominant. No intervention tried prior to arrival.  History reviewed. No pertinent past medical history. Past Surgical History  Procedure Laterality Date  . Wisdom tooth extraction    . Refractive surgery      RT eye   No family history on file. Social History  Substance Use Topics  . Smoking status: Current Every Day Smoker -- 0.00 packs/day    Types: Cigarettes  . Smokeless tobacco: Never Used  . Alcohol Use: No   OB History    Gravida Para Term Preterm AB TAB SAB Ectopic Multiple Living   0 0 0 0 0 0 0 0       Review of Systems  Musculoskeletal: Positive for arthralgias.  All other systems reviewed and are negative.     Allergies  Review of patient's allergies indicates no known allergies.  Home Medications   Prior to Admission medications   Medication Sig Start Date End Date Taking? Authorizing Provider  methocarbamol (ROBAXIN) 500 MG tablet Take 1 tablet (500 mg total) by mouth 2 (two) times daily. 09/30/14   Fayrene HelperBowie Tran, PA-C  naproxen (NAPROSYN) 500 MG tablet Take 1 tablet (500 mg total) by mouth 2 (two) times daily. 10/01/14   Felicie Mornavid Smith, NP   BP 129/80 mmHg  Pulse 97  Temp(Src) 98.5 F (36.9 C) (Oral)  Resp 16  Ht 5\' 9"  (1.753 m)  Wt 74.844 kg  BMI 24.36 kg/m2  SpO2 96%   Physical Exam  Constitutional: She is oriented to  person, place, and time. She appears well-developed and well-nourished. No distress.  HENT:  Head: Normocephalic and atraumatic.  Mouth/Throat: Oropharynx is clear and moist.  Eyes: Conjunctivae and EOM are normal. Pupils are equal, round, and reactive to light.  Neck: Normal range of motion. Neck supple.  Cardiovascular: Normal rate, regular rhythm and normal heart sounds.   Pulmonary/Chest: Effort normal and breath sounds normal. No respiratory distress. She has no wheezes.  Abdominal: Soft. Bowel sounds are normal. There is no tenderness. There is no guarding.  Musculoskeletal: Normal range of motion. She exhibits no edema.       Right hand: She exhibits tenderness, bony tenderness and swelling. She exhibits normal range of motion, normal capillary refill, no deformity and no laceration. Normal sensation noted. Normal strength noted.       Hands: Right dorsal hand with mild swelling and bruising over 4th and 5th metacarpals; full flexion/extension of all fingers; normal grip strength; strong radial pulse and cap refill; normal sensation throughout  Neurological: She is alert and oriented to person, place, and time.  Skin: Skin is warm and dry. She is not diaphoretic.  Psychiatric: She has a normal mood and affect.  Nursing note and vitals reviewed.   ED Course  ORTHOPEDIC INJURY TREATMENT Date/Time: 07/31/2015 9:37 PM Performed by: Garlon HatchetSANDERS, Heitor Steinhoff M Authorized by: Garlon HatchetSANDERS, Cintya Daughety M  Consent: Verbal consent obtained. Risks and benefits: risks, benefits and alternatives were discussed Consent given by: patient Patient understanding: patient states understanding of the procedure being performed Required items: required blood products, implants, devices, and special equipment available Patient identity confirmed: verbally with patient Injury location: hand Location details: right hand Injury type: soft tissue Pre-procedure neurovascular assessment: neurovascularly intact Immobilization:  brace Supplies used: elastic bandage Post-procedure neurovascular assessment: post-procedure neurovascularly intact Patient tolerance: Patient tolerated the procedure well with no immediate complications   (including critical care time) Labs Review Labs Reviewed - No data to display  Imaging Review Dg Hand Complete Right  07/31/2015  CLINICAL DATA:  Right thumb and little finger pain after hitting her hand on a wall today. EXAM: RIGHT HAND - COMPLETE 3+ VIEW COMPARISON:  None. FINDINGS: There is no evidence of fracture or dislocation. There is no evidence of arthropathy or other focal bone abnormality. Soft tissues are unremarkable. IMPRESSION: Normal examination. Electronically Signed   By: Beckie Salts M.D.   On: 07/31/2015 20:44   I have personally reviewed and evaluated these images and lab results as part of my medical decision-making.   EKG Interpretation None      MDM   Final diagnoses:  Hand injury, right, initial encounter   19 year old female here with right hand pain after punching a door. She has mild swelling and bruising noted over right fourth and fifth metacarpals without acute bony deformity. Her hand is neurovascularly intact. X-rays negative for acute bony findings. Ace wrap was applied for comfort, discharged home with supportive care.  Discussed plan with patient, he/she acknowledged understanding and agreed with plan of care.  Return precautions given for new or worsening symptoms.  Garlon Hatchet, PA-C 07/31/15 2137  Marily Memos, MD 07/31/15 [phone]

## 2015-07-31 NOTE — ED Notes (Signed)
Patient left at this time with all belongings. 

## 2015-07-31 NOTE — Discharge Instructions (Signed)
Make take tylenol or motrin as needed for pain. May need to ice/elevate hand to help with pain/swelling. Follow-up with your primary care physician. Return here for new concerns.

## 2015-07-31 NOTE — ED Notes (Signed)
Pt. presents with right hand pain with mild swelling injured this evening after punching a wall .

## 2015-12-17 ENCOUNTER — Emergency Department (HOSPITAL_COMMUNITY): Payer: Self-pay

## 2015-12-17 ENCOUNTER — Encounter (HOSPITAL_COMMUNITY): Payer: Self-pay | Admitting: Emergency Medicine

## 2015-12-17 ENCOUNTER — Emergency Department (HOSPITAL_COMMUNITY)
Admission: EM | Admit: 2015-12-17 | Discharge: 2015-12-17 | Disposition: A | Discharge status: Home / Self Care | Payer: Self-pay | Attending: Emergency Medicine | Admitting: Emergency Medicine

## 2015-12-17 DIAGNOSIS — Z3202 Encounter for pregnancy test, result negative: Secondary | ICD-10-CM | POA: Insufficient documentation

## 2015-12-17 DIAGNOSIS — R911 Solitary pulmonary nodule: Secondary | ICD-10-CM

## 2015-12-17 DIAGNOSIS — N200 Calculus of kidney: Secondary | ICD-10-CM

## 2015-12-17 DIAGNOSIS — F17200 Nicotine dependence, unspecified, uncomplicated: Secondary | ICD-10-CM

## 2015-12-17 DIAGNOSIS — F17210 Nicotine dependence, cigarettes, uncomplicated: Secondary | ICD-10-CM | POA: Insufficient documentation

## 2015-12-17 DIAGNOSIS — R1031 Right lower quadrant pain: Secondary | ICD-10-CM

## 2015-12-17 LAB — URINALYSIS, ROUTINE W REFLEX MICROSCOPIC
BILIRUBIN URINE: NEGATIVE
Glucose, UA: NEGATIVE mg/dL
KETONES UR: NEGATIVE mg/dL
Nitrite: NEGATIVE
PROTEIN: NEGATIVE mg/dL
SPECIFIC GRAVITY, URINE: 1.026 (ref 1.005–1.030)
pH: 6 (ref 5.0–8.0)

## 2015-12-17 LAB — WET PREP, GENITAL
Sperm: NONE SEEN
Trich, Wet Prep: NONE SEEN
Yeast Wet Prep HPF POC: NONE SEEN

## 2015-12-17 LAB — URINE MICROSCOPIC-ADD ON

## 2015-12-17 LAB — COMPREHENSIVE METABOLIC PANEL
ALBUMIN: 4.2 g/dL (ref 3.5–5.0)
ALK PHOS: 73 U/L (ref 38–126)
ALT: 12 U/L — AB (ref 14–54)
AST: 18 U/L (ref 15–41)
Anion gap: 9 (ref 5–15)
BILIRUBIN TOTAL: 0.3 mg/dL (ref 0.3–1.2)
BUN: 11 mg/dL (ref 6–20)
CALCIUM: 9 mg/dL (ref 8.9–10.3)
CO2: 24 mmol/L (ref 22–32)
CREATININE: 0.82 mg/dL (ref 0.44–1.00)
Chloride: 107 mmol/L (ref 101–111)
GFR calc Af Amer: >60 mL/min (ref >60)
GFR calc non Af Amer: >60 mL/min (ref >60)
GLUCOSE: 133 mg/dL — AB (ref 65–99)
Potassium: 3.6 mmol/L (ref 3.5–5.1)
SODIUM: 140 mmol/L (ref 135–145)
TOTAL PROTEIN: 7.7 g/dL (ref 6.5–8.1)

## 2015-12-17 LAB — CBC
HCT: 41.1 % (ref 36.0–46.0)
Hemoglobin: 14.3 g/dL (ref 12.0–15.0)
MCH: 30 pg (ref 26.0–34.0)
MCHC: 34.8 g/dL (ref 30.0–36.0)
MCV: 86.2 fL (ref 78.0–100.0)
PLATELETS: 285 10*3/uL (ref 150–400)
RBC: 4.77 MIL/uL (ref 3.87–5.11)
RDW: 12.9 % (ref 11.5–15.5)
WBC: 15.3 10*3/uL — ABNORMAL HIGH (ref 4.0–10.5)

## 2015-12-17 LAB — I-STAT BETA HCG BLOOD, ED (MC, WL, AP ONLY)

## 2015-12-17 LAB — LIPASE, BLOOD: Lipase: 20 U/L (ref 11–51)

## 2015-12-17 MED ORDER — ONDANSETRON HCL 4 MG/2ML IJ SOLN
4.0000 mg | Freq: Once | INTRAMUSCULAR | Status: AC
  Administered 2015-12-17: 4 mg via INTRAVENOUS
  Filled 2015-12-17: qty 2

## 2015-12-17 MED ORDER — SODIUM CHLORIDE 0.9 % IV BOLUS (SEPSIS)
1000.0000 mL | Freq: Once | INTRAVENOUS | Status: AC
  Administered 2015-12-17: 1000 mL via INTRAVENOUS

## 2015-12-17 MED ORDER — MORPHINE SULFATE (PF) 4 MG/ML IV SOLN
4.0000 mg | Freq: Once | INTRAVENOUS | Status: AC
  Administered 2015-12-17: 4 mg via INTRAVENOUS
  Filled 2015-12-17: qty 1

## 2015-12-17 MED ORDER — OXYCODONE-ACETAMINOPHEN 5-325 MG PO TABS: ORAL_TABLET | ORAL | Status: DC

## 2015-12-17 MED ORDER — IOPAMIDOL (ISOVUE-300) INJECTION 61%
100.0000 mL | Freq: Once | INTRAVENOUS | Status: AC | PRN
Start: 2015-12-17 — End: 2015-12-17
  Administered 2015-12-17: 100 mL via INTRAVENOUS

## 2015-12-17 NOTE — ED Notes (Signed)
Pt transported to [facility]

## 2015-12-17 NOTE — ED Notes (Signed)
Bed: WA03 Expected date:  Expected time:  Means of arrival:  Comments: EMS- 20 yo RLQ pain

## 2015-12-17 NOTE — ED Notes (Addendum)
Per EMS pt c/o of abdominal pain in lower right quadrant. Emesis occurred this morning and then again with EMS. Zofran was given en route.

## 2015-12-17 NOTE — Discharge Instructions (Signed)
Your imaging today shows nodules in your lungs. Your primary care doctor must be made aware of this and further imaging may be ordered. Please make them aware that they will need to obtain records for further management.  Push fluids: take small frequent sips of water or Gatorade, do not drink any soda, juice or caffeinated beverages.  Take percocet for breakthrough pain, do not drink alcohol, drive, care for children or do other critical tasks while taking percocet.  Strain all urine and retain any stone for analysis at your urologist.   Return to the ED if you develop fever, have vomiting that is not controlled with the medication or if pain becomes too severe.  Do not hesitate to return to the emergency room for any new, worsening or concerning symptoms.  Please obtain primary care using resource guide below. Let them know that you were seen in the emergency room and that they will need to obtain records for further outpatient management.    Kidney Stones Kidney stones (urolithiasis) are deposits that form inside your kidneys. The intense pain is caused by the stone moving through the urinary tract. When the stone moves, the ureter goes into spasm around the stone. The stone is usually passed in the urine.  CAUSES   A disorder that makes certain neck glands produce too much parathyroid hormone (primary hyperparathyroidism).  A buildup of uric acid crystals, similar to gout in your joints.  Narrowing (stricture) of the ureter.  A kidney obstruction present at birth (congenital obstruction).  Previous surgery on the kidney or ureters.  Numerous kidney infections. SYMPTOMS   Feeling sick to your stomach (nauseous).  Throwing up (vomiting).  Blood in the urine (hematuria).  Pain that usually spreads (radiates) to the groin.  Frequency or urgency of urination. DIAGNOSIS   Taking a history and physical exam.  Blood or urine tests.  CT scan.  Occasionally, an examination  of the inside of the urinary bladder (cystoscopy) is performed. TREATMENT   Observation.  Increasing your fluid intake.  Extracorporeal shock wave lithotripsy--This is a noninvasive procedure that uses shock waves to break up kidney stones.  Surgery may be needed if you have severe pain or persistent obstruction. There are various surgical procedures. Most of the procedures are performed with the use of small instruments. Only small incisions are needed to accommodate these instruments, so recovery time is minimized. The size, location, and chemical composition are all important variables that will determine the proper choice of action for you. Talk to your health care provider to better understand your situation so that you will minimize the risk of injury to yourself and your kidney.  HOME CARE INSTRUCTIONS   Drink enough water and fluids to keep your urine clear or pale yellow. This will help you to pass the stone or stone fragments.  Strain all urine through the provided strainer. Keep all particulate matter and stones for your health care provider to see. The stone causing the pain may be as small as a grain of salt. It is very important to use the strainer each and every time you pass your urine. The collection of your stone will allow your health care provider to analyze it and verify that a stone has actually passed. The stone analysis will often identify what you can do to reduce the incidence of recurrences.  Only take over-the-counter or prescription medicines for pain, discomfort, or fever as directed by your health care provider.  Keep all follow-up visits as told  by your health care provider. This is important.  Get follow-up X-rays if required. The absence of pain does not always mean that the stone has passed. It may have only stopped moving. If the urine remains completely obstructed, it can cause loss of kidney function or even complete destruction of the kidney. It is your  responsibility to make sure X-rays and follow-ups are completed. Ultrasounds of the kidney can show blockages and the status of the kidney. Ultrasounds are not associated with any radiation and can be performed easily in a matter of minutes.  Make changes to your daily diet as told by your health care provider. You may be told to:  Limit the amount of salt that you eat.  Eat 5 or more servings of fruits and vegetables each day.  Limit the amount of meat, poultry, fish, and eggs that you eat.  Collect a 24-hour urine sample as told by your health care provider.You may need to collect another urine sample every 6-12 months. SEEK MEDICAL CARE IF:  You experience pain that is progressive and unresponsive to any pain medicine you have been prescribed. SEEK IMMEDIATE MEDICAL CARE IF:   Pain cannot be controlled with the prescribed medicine.  You have a fever or shaking chills.  The severity or intensity of pain increases over 18 hours and is not relieved by pain medicine.  You develop a new onset of abdominal pain.  You feel faint or pass out.  You are unable to urinate.   This information is not intended to replace advice given to you by your health care provider. Make sure you discuss any questions you have with your health care provider.   Document Released: 08/13/2005 Document Revised: 05/04/2015 Document Reviewed: 01/14/2013 Elsevier Interactive Patient Education 2016 [employer].  [employer] Assistance The [employer] Ways 211 is a great source of information about community services available.  Access by dialing 2-1-1 from anywhere in [place], or by website -  PooledIncome.pl.   Other Local Resources (Updated 08/2015)  Financial Assistance   Services    Phone Number and Address  [facility]  Low-cost medical care - 1st and 3rd Saturday of every month  Must not qualify for public or private insurance and must have limited  income [phone] [address] [place], [place]    [place] [employer] of Social Services  Child care  Emergency assistance for housing and [employer]  Medicaid [phone] [address] [place], [place] [address]   [facility] Department  Low-cost medical care for children, communicable diseases, sexually-transmitted diseases, immunizations, maternity care, womens health and family planning [phone] [address] [place], [place] [address]  [facility]   Medication assistance for [facility] residents  Must meet income requirements [phone] [address] [place], [place].    [facility] Social Services  Child care  Emergency assistance for housing and [employer]  Medicaid [phone] [address] [place], [place] [address]  [facility]   Low-cost medical care,   Monday through Friday, 9 am to 6 pm.   Accepts Medicare/Medicaid, and self-pay [phone] [address] [place], [place] [address]  [facility]  Low-cost medical care - Monday through Friday, 8:30 am - 5:30 pm  Accepts Medicaid and self-pay [phone] [address] [place], [place] [address]   [facility]  Primary medical care,  including for those with sickle cell disease  Accepts Medicare, Medicaid, insurance and self-pay [phone] [address] [place], [place]  [facility]   Primary medical care  Accepts Medicare, [place], insurance and self-pay [phone] [address] [place], [place] [address]   [facility] Department of Social Services  Child care  Emergency assistance for housing and [employer]  Medicaid [phone] [address] [place], [place] [address]  [facility] Department of Health and  [employer]  Child care  Emergency assistance for housing and [employer]  Medicaid [phone] [address] [place], [place] [address]   [facility] Medication Assistance Program  Medication assistance for [facility] residents with no insurance only  Must have a primary care doctor [phone] [address] [place], [place]  [facility]   Primary medical care  [place], [place], insurance  [phone] [address] [place], [place]  [facility]   Medication assistance [phone]  [facility]   Primary medical care  Accepts Medicare, [place], insurance and self-pay [phone] [address] [place], [place] [address]  [facility]   Primary medical care  Accepts Medicare, [place], insurance and self-pay [phone] [address] [place], [place] [address]  [facility]  For [place] residents between the ages of 41 and 2 who do not have any form of health insurance, Medicare, [place], or [place] benefits.  Services are provided free of charge to uninsured patients who fall within federal poverty guidelines.    Hours: Tuesdays and Thursdays, 4:15 - 8 pm [phone] [address] [place], [place] [address]  [facility]     Primary medical care  Dental care  Nutritional counseling  Pharmacy  Accepts Medicaid, Medicare, most insurance.  Fees are adjusted based on ability to pay.   [phone] [facility] [address] [place], [place]  [phone] [facility] [address] [place], [place]  [phone] [facility] [place], [place]  [phone] [facility], [address] [place], [place]  [phone] [facility] [address] [place], [place]  [facility]  Womens  health and family planning [phone] [address] [place], [place]  [facility] Department of Social Services  Child care  Emergency assistance for housing and [employer]  Medicaid [phone] [address] [place], [place] [address]   [facility]    Ages 15 and older  Hours: Mondays and Thursdays, 7:00 am - 9:00 am Patients are seen on a first come, first served basis. [phone], [phone] [place], [place]  [facility] Division of Social Services  Child care  Emergency assistance for housing and [employer]  Medicaid [phone] [address] [place], [place] [address]  [facility]  Medication assistance  Rental assistance  Food pantry  Medication assistance  Housing assistance  Emergency food distribution  Utility assistance [phone] [address] [place], [place]  [phone]  [address] [place], [place] [address] Hours: Tuesdays and Thursdays from 9am - 12 noon by appointment only  [phone] [address] [place], [place] [address]  [facility] - Lanae Bo[facility]   Accepts private insurance, [place], and [place].  Payment is based on a sliding scale for those without insurance.  Hours: Mondays, Tuesdays and Thursdays,  8:30 am - 5:30 pm.   [phone] [address] [place], [place]  [facility] - Family Medicine at [facility]    Accepts private insurance, [place], and [place].  Payment is based on a sliding scale for those without insurance. [phone] [address] [place], [place]  [facility] - Pediatrics at [address] Scientist, research (physical sciences)[place]  Accepts private insurance, [employer]icare, and [place].  Payment is based on a sliding scale for those without insurance [phone] [address], [place], [place]  [facility] - [facility] at [facility]  Accepts  private insurance, [place], and [place].  Payment is based on a sliding scale for those without insurance. [phone] [address] [place], [place]  [facility] - [facility] at [facility]  Accepts private insurance, [place], and [place].  Payment is based on a sliding scale for those without insurance. [phone], [phone] [place], [place].    [facility]  Maternity care.  Accepts Medicaid and self-pay. [phone] [address] [place], [place]

## 2015-12-17 NOTE — ED Provider Notes (Signed)
CSN: [id number]     Arrival date & time 12/17/15  1128 History   First MD Initiated Contact with Patient 12/17/15 1203     Chief Complaint  Patient presents with  . Abdominal Pain     (Consider location/radiation/quality/duration/timing/severity/associated sxs/prior Treatment) HPI   Blood pressure 120/93, pulse 81, temperature 97.6 F (36.4 C), temperature source Oral, resp. rate 16, height 5\' 8"  (1.727 m), weight 81.647 kg, SpO2 100 %.  Samantha Carroll is a 20 y.o. female complaining of right lower quadrant abdominal pain onset yesterday, spontaneously resolved and then occurred again this morning around 9 AM with 2 episodes of nonbloody, nonbilious, no coffee-ground emesis. Pain colicky. No pain medication taken prior to arrival. Patient denies fever, chills, change in bowel or bladder habits, abnormal vaginal discharge, history of kidney stones, prior surgeries, history of ovarian cysts  History reviewed. No pertinent past medical history. Past Surgical History  Procedure Laterality Date  . Wisdom tooth extraction    . Refractive surgery      RT eye   No family history on file. Social History  Substance Use Topics  . Smoking status: Current Every Day Smoker -- 0.00 packs/day    Types: Cigarettes  . Smokeless tobacco: Never Used  . Alcohol Use: No   OB History    Gravida Para Term Preterm AB TAB SAB Ectopic Multiple Living   0 0 0 0 0 0 0 0       Review of Systems  10 systems reviewed and found to be negative, except as noted in the HPI.   Allergies  Review of patient's allergies indicates no known allergies.  Home Medications   Prior to Admission medications   Medication Sig Start Date End Date Taking? Authorizing Provider  methocarbamol (ROBAXIN) 500 MG tablet Take 1 tablet (500 mg total) by mouth 2 (two) times daily. Patient not taking: Reported on 12/17/2015 09/30/14   Fayrene HelperBowie Tran, PA-C  naproxen (NAPROSYN) 500 MG tablet Take 1 tablet (500 mg total) by mouth 2  (two) times daily. Patient not taking: Reported on 12/17/2015 10/01/14   Felicie Mornavid Smith, NP  oxyCODONE-acetaminophen (PERCOCET/ROXICET) 5-325 MG tablet 1 to 2 tabs PO q6hrs  PRN for pain 12/17/15   Joni ReiningNicole Kelsee Preslar, PA-C   BP 112/72 mmHg  Pulse 94  Temp(Src) 98.3 F (36.8 C) (Oral)  Resp 16  Ht 5\' 8"  (1.727 m)  Wt 81.647 kg  BMI 27.38 kg/m2  SpO2 100% Physical Exam  Constitutional: She is oriented to person, place, and time. She appears well-developed and well-nourished. No distress.  HENT:  Head: Normocephalic and atraumatic.  Mouth/Throat: Oropharynx is clear and moist.  Eyes: Conjunctivae and EOM are normal. Pupils are equal, round, and reactive to light.  Neck: Normal range of motion.  Cardiovascular: Normal rate, regular rhythm and intact distal pulses.   Pulmonary/Chest: Effort normal and breath sounds normal.  Abdominal: Soft. There is tenderness.  Palpation the right lower quadrant with no guarding or rebound, Rovsing negative, psoas positive, obturator negative.  Musculoskeletal: Normal range of motion.  Neurological: She is alert and oriented to person, place, and time.  Skin: She is not diaphoretic.  Psychiatric: She has a normal mood and affect.  Nursing note and vitals reviewed.   ED Course  Procedures (including critical care time) Labs Review Labs Reviewed  WET PREP, GENITAL - Abnormal; Notable for the following:    Clue Cells Wet Prep HPF POC PRESENT (*)    WBC, Wet Prep HPF POC FEW (*)  All other components within normal limits  COMPREHENSIVE METABOLIC PANEL - Abnormal; Notable for the following:    Glucose, Bld 133 (*)    ALT 12 (*)    All other components within normal limits  CBC - Abnormal; Notable for the following:    WBC 15.3 (*)    All other components within normal limits  URINALYSIS, ROUTINE W REFLEX MICROSCOPIC (NOT AT [facility]) - Abnormal; Notable for the following:    APPearance HAZY (*)    Hgb urine dipstick MODERATE (*)    Leukocytes, UA TRACE  (*)    All other components within normal limits  URINE MICROSCOPIC-ADD ON - Abnormal; Notable for the following:    Squamous Epithelial / LPF 6-30 (*)    Bacteria, UA RARE (*)    All other components within normal limits  LIPASE, BLOOD  RPR  HIV ANTIBODY (ROUTINE TESTING)  I-STAT BETA HCG BLOOD, ED ([facility], [facility], [facility] ONLY)  GC/CHLAMYDIA PROBE AMP ([place]) NOT AT [facility]    Imaging Review US Transvaginal Non-ob  12/17/2015  CLINICAL DATA:  20 year old female with right lower quadrant pain since yesterday morning. Irregular menstrual cycles on Implanon. LMP January 2017. EXAM: TRANSABDOMINAL AND TRANSVAGINAL ULTRASOUND OF PELVIS DOPPLER ULTRASOUND OF OVARIES TECHNIQUE: Both transabdominal and transvaginal ultrasound examinations of the pelvis were performed. Transabdominal technique was performed for global imaging of the pelvis including uterus, ovaries, adnexal regions, and pelvic cul-de-sac. It was necessary to proceed with endovaginal exam following the transabdominal exam to visualize the endometrium and adnexa. Color and duplex Doppler ultrasound was utilized to evaluate blood flow to the ovaries. COMPARISON:  None. FINDINGS: Uterus Measurements: 7.5 x 2.8 x 3.5 cm. Anteverted anteflexed uterus is normal in size and configuration. No uterine fibroids or other myometrial abnormalities. Endometrium Thickness: 2 mm.  No focal abnormality visualized. Right ovary Measurements: 4.0 x 2.1 x 2.2 cm. Normal appearance/no adnexal mass. Left ovary Measurements: 3.6 x 2.3 x 2.1 cm. Normal appearance/no adnexal mass. Pulsed Doppler evaluation of both ovaries demonstrates normal low-resistance arterial and venous waveforms. Other findings No abnormal free fluid. IMPRESSION: Normal pelvic sonogram. Normal ovaries, with no evidence of adnexal torsion. Electronically Signed   By: Delbert Phenix M.D.   On: 12/17/2015 13:55   US Pelvis Complete  12/17/2015  CLINICAL DATA:  20 year old female with right lower quadrant  pain since yesterday morning. Irregular menstrual cycles on Implanon. LMP January 2017. EXAM: TRANSABDOMINAL AND TRANSVAGINAL ULTRASOUND OF PELVIS DOPPLER ULTRASOUND OF OVARIES TECHNIQUE: Both transabdominal and transvaginal ultrasound examinations of the pelvis were performed. Transabdominal technique was performed for global imaging of the pelvis including uterus, ovaries, adnexal regions, and pelvic cul-de-sac. It was necessary to proceed with endovaginal exam following the transabdominal exam to visualize the endometrium and adnexa. Color and duplex Doppler ultrasound was utilized to evaluate blood flow to the ovaries. COMPARISON:  None. FINDINGS: Uterus Measurements: 7.5 x 2.8 x 3.5 cm. Anteverted anteflexed uterus is normal in size and configuration. No uterine fibroids or other myometrial abnormalities. Endometrium Thickness: 2 mm.  No focal abnormality visualized. Right ovary Measurements: 4.0 x 2.1 x 2.2 cm. Normal appearance/no adnexal mass. Left ovary Measurements: 3.6 x 2.3 x 2.1 cm. Normal appearance/no adnexal mass. Pulsed Doppler evaluation of both ovaries demonstrates normal low-resistance arterial and venous waveforms. Other findings No abnormal free fluid. IMPRESSION: Normal pelvic sonogram. Normal ovaries, with no evidence of adnexal torsion. Electronically Signed   By: Delbert Phenix M.D.   On: 12/17/2015 13:55   Ct Abdomen Pelvis  W Contrast  12/17/2015  CLINICAL DATA:  Right lower quadrant abdominal pain with vomiting. EXAM: CT ABDOMEN AND PELVIS WITH CONTRAST TECHNIQUE: Multidetector CT imaging of the abdomen and pelvis was performed using the standard protocol following bolus administration of intravenous contrast. CONTRAST:  ISOVUE-300 IOPAMIDOL (ISOVUE-300) INJECTION 61% COMPARISON:  Pelvic sonogram from earlier today. No prior CT abdomen/ pelvis. FINDINGS: Lower chest: There are several (at least 8) scattered tiny pulmonary nodules at both lung bases, predominantly subpleural,  largest 3 mm in the anterior right lower lobe (series 4/image 11). Hepatobiliary: Normal liver with no liver mass. Normal gallbladder with no radiopaque cholelithiasis. No biliary ductal dilatation. Pancreas: Normal, with no mass or duct dilation. Spleen: Normal size. No mass. Adrenals/Urinary Tract: Normal adrenals. Obstructing 3 mm stone in the distal right pelvic ureter approximately 1 cm above the right ureterovesical junction, with mild right hydroureteronephrosis. Additional nonobstructing 3 mm stone in the lower right kidney. No left hydronephrosis. Normal size kidneys with no renal masses. Normal bladder. Stomach/Bowel: Grossly normal stomach. Normal caliber small bowel with no small bowel wall thickening. Normal appendix. Normal large bowel with no diverticulosis, large bowel wall thickening or pericolonic fat stranding. Vascular/Lymphatic: Normal caliber abdominal aorta. Patent portal, splenic, hepatic and renal veins. No pathologically enlarged lymph nodes in the abdomen or pelvis. Reproductive: Grossly normal uterus.  No adnexal mass. Other: No pneumoperitoneum, ascites or focal fluid collection. Musculoskeletal: No aggressive appearing focal osseous lesions. IMPRESSION: 1. Obstructing 3 mm stone in the distal right pelvic ureter approximately 1 cm above the right UVJ, with mild right hydroureteronephrosis. 2. Additional nonobstructing 3 mm right renal stone. 3. Tiny pulmonary nodules at the lung bases, largest 3 mm, normal certainly benign. No follow-up needed if patient is low-risk (and has no known or suspected primary neoplasm). Non-contrast chest CT can be considered in 12 months if patient is high-risk. This recommendation follows the consensus statement: Guidelines for Management of Incidental Pulmonary Nodules Detected on CT Images:From the Fleischner Society 2017; published online before print (10.1148/radiol.1610960454). Electronically Signed   By: Delbert Phenix M.D.   On: 12/17/2015 14:24    [place] Art/ven Flow Abd Pelv Doppler  12/17/2015  CLINICAL DATA:  20 year old female with right lower quadrant pain since yesterday morning. Irregular menstrual cycles on Implanon. LMP January 2017. EXAM: TRANSABDOMINAL AND TRANSVAGINAL ULTRASOUND OF PELVIS DOPPLER ULTRASOUND OF OVARIES TECHNIQUE: Both transabdominal and transvaginal ultrasound examinations of the pelvis were performed. Transabdominal technique was performed for global imaging of the pelvis including uterus, ovaries, adnexal regions, and pelvic cul-de-sac. It was necessary to proceed with endovaginal exam following the transabdominal exam to visualize the endometrium and adnexa. Color and duplex Doppler ultrasound was utilized to evaluate blood flow to the ovaries. COMPARISON:  None. FINDINGS: Uterus Measurements: 7.5 x 2.8 x 3.5 cm. Anteverted anteflexed uterus is normal in size and configuration. No uterine fibroids or other myometrial abnormalities. Endometrium Thickness: 2 mm.  No focal abnormality visualized. Right ovary Measurements: 4.0 x 2.1 x 2.2 cm. Normal appearance/no adnexal mass. Left ovary Measurements: 3.6 x 2.3 x 2.1 cm. Normal appearance/no adnexal mass. Pulsed Doppler evaluation of both ovaries demonstrates normal low-resistance arterial and venous waveforms. Other findings No abnormal free fluid. IMPRESSION: Normal pelvic sonogram. Normal ovaries, with no evidence of adnexal torsion. Electronically Signed   By: Delbert Phenix M.D.   On: 12/17/2015 13:55   I have personally reviewed and evaluated these images and lab results as part of my medical decision-making.   EKG Interpretation None  MDM   Final diagnoses:  RLQ abdominal pain  Right kidney stone  Pulmonary nodule  Tobacco use disorder    Filed Vitals:   12/17/15 1132 12/17/15 1135 12/17/15 1340 12/17/15 1440  BP:  120/93 121/77 112/72  Pulse:  81 72 94  Temp:  97.6 F (36.4 C)  98.3 F (36.8 C)  TempSrc:  Oral  Oral  Resp:  Height:  5'  8" (1.727 m)    Weight:  81.647 kg    SpO2: 100% 100% 100% 100%    Medications  sodium chloride 0.9 % bolus 1,000 mL (0 mLs Intravenous Stopped 12/17/15 1331)  morphine 4 MG/ML injection 4 mg (4 mg Intravenous Given 12/17/15 1230)  ondansetron (ZOFRAN) injection 4 mg (4 mg Intravenous Given 12/17/15 1230)  iopamidol (ISOVUE-300) 61 % injection 100 mL (100 mLs Intravenous Contrast Given 12/17/15 1404)  morphine 4 MG/ML injection 4 mg (4 mg Intravenous Given 12/17/15 1442)    JEIMY BICKERT is 20 y.o. female presenting with Right lower quadrant abdominal pain onset yesterday, spontaneously resolved and recurred today associated with several episodes of emesis. No GU complaints. Abdominal exam is nonsurgical. Pelvic exam without acute abnormality.  HCG negative. Contaminated urinalysis does not appear grossly infected however there is a moderate amount of hemoglobin. White count of 13 and no electrolyte abnormalities.  Pelvic ultrasound with normal ovary, normal blood flow. CT demonstrates a 3 mm stone in the right UVJ, they also note a pulmonary nodule. Counseled patient on smoking cessation. Recommend  Evaluation does not show pathology that would require ongoing emergent intervention or inpatient treatment. Pt is hemodynamically stable and mentating appropriately. Discussed findings and plan with patient/guardian, who agrees with care plan. All questions answered. Return precautions discussed and outpatient follow up given.   New Prescriptions   OXYCODONE-ACETAMINOPHEN (PERCOCET/ROXICET) 5-325 MG TABLET    1 to 2 tabs PO q6hrs  PRN for pain         Wynetta Emery, PA-C 12/17/15 1516  Doug Sou, MD 12/17/15 1554

## 2015-12-18 LAB — HIV ANTIBODY (ROUTINE TESTING W REFLEX): HIV Screen 4th Generation wRfx: NONREACTIVE

## 2015-12-18 LAB — RPR: RPR Ser Ql: NONREACTIVE

## 2015-12-19 LAB — GC/CHLAMYDIA PROBE AMP (~~LOC~~) NOT AT ARMC
CHLAMYDIA, DNA PROBE: NEGATIVE
Neisseria Gonorrhea: NEGATIVE

## 2016-01-23 ENCOUNTER — Encounter (HOSPITAL_COMMUNITY): Payer: Self-pay

## 2016-01-23 ENCOUNTER — Emergency Department (HOSPITAL_COMMUNITY)
Admission: EM | Admit: 2016-01-23 | Discharge: 2016-01-23 | Disposition: A | Discharge status: Home / Self Care | Payer: No Typology Code available for payment source | Attending: Emergency Medicine | Admitting: Emergency Medicine

## 2016-01-23 DIAGNOSIS — F17210 Nicotine dependence, cigarettes, uncomplicated: Secondary | ICD-10-CM | POA: Insufficient documentation

## 2016-01-23 DIAGNOSIS — N6310 Unspecified lump in the right breast, unspecified quadrant: Secondary | ICD-10-CM

## 2016-01-23 DIAGNOSIS — N63 Unspecified lump in breast: Secondary | ICD-10-CM | POA: Insufficient documentation

## 2016-01-23 DIAGNOSIS — Z79899 Other long term (current) drug therapy: Secondary | ICD-10-CM | POA: Insufficient documentation

## 2016-01-23 DIAGNOSIS — Z791 Long term (current) use of non-steroidal anti-inflammatories (NSAID): Secondary | ICD-10-CM | POA: Insufficient documentation

## 2016-01-23 NOTE — ED Provider Notes (Signed)
CSN: [id number]     Arrival date & time 01/23/16  1522 History   First MD Initiated Contact with Patient 01/23/16 1708     Chief Complaint  Patient presents with  . lump on breast      (Consider location/radiation/quality/duration/timing/severity/associated sxs/prior Treatment) HPI   Patient is a [age] female with no pertinent past medical history presents the ED with complaint of right breast lump, onset 1.5 weeks. She reports noticing a lump to her right breast which she notes has increased in size over the week and endorses associated pain. Denies fever, chills, nipple d/c, redness, swelling, warmth. Pt denies taking any medications PTA. Denies hx of any recent trauma to area.  History reviewed. No pertinent past medical history. Past Surgical History  Procedure Laterality Date  . Wisdom tooth extraction    . Refractive surgery      RT eye  . Eye surgery     No family history on file. Social History  Substance Use Topics  . Smoking status: Current Every Day Smoker -- 0.50 packs/day    Types: Cigarettes  . Smokeless tobacco: Never Used  . Alcohol Use: No   OB History    Gravida Para Term Preterm AB TAB SAB Ectopic Multiple Living   0 0 0 0 0 0 0 0       Review of Systems  Skin:       Right breast lump and pain  All other systems reviewed and are negative.     Allergies  Review of patient's allergies indicates no known allergies.  Home Medications   Prior to Admission medications   Medication Sig Start Date End Date Taking? Authorizing Provider  methocarbamol (ROBAXIN) 500 MG tablet Take 1 tablet (500 mg total) by mouth 2 (two) times daily. Patient not taking: Reported on 12/17/2015 09/30/14   Fayrene HelperBowie Tran, PA-C  naproxen (NAPROSYN) 500 MG tablet Take 1 tablet (500 mg total) by mouth 2 (two) times daily. Patient not taking: Reported on 12/17/2015 10/01/14   Felicie Mornavid Smith, NP  oxyCODONE-acetaminophen (PERCOCET/ROXICET) 5-325 MG tablet 1 to 2 tabs PO q6hrs  PRN for  pain Patient not taking: Reported on 01/23/2016 12/17/15   Joni ReiningNicole Pisciotta, PA-C   BP 119/74 mmHg  Pulse 84  Temp(Src) 98.3 F (36.8 C) (Oral)  Resp 17  SpO2 99% Physical Exam  Constitutional: She is oriented to person, place, and time. She appears well-developed and well-nourished.  HENT:  Head: Normocephalic and atraumatic.  Eyes: Conjunctivae and EOM are normal. Right eye exhibits no discharge. Left eye exhibits no discharge. No scleral icterus.  Neck: Normal range of motion. Neck supple.  Cardiovascular: Normal rate, regular rhythm, normal heart sounds and intact distal pulses.   Pulmonary/Chest: Effort normal and breath sounds normal. No respiratory distress. She has no wheezes. She has no rales. She exhibits no tenderness. Right breast exhibits mass and tenderness. Right breast exhibits no inverted nipple, no nipple discharge and no skin change. Left breast exhibits no inverted nipple, no mass, no nipple discharge, no skin change and no tenderness. Breasts are symmetrical.    1x2cm mobile mass at right breast at 5 o'clock region, mild TTP. No surrounding redness, swelling, warmth or drainage.   Abdominal: Soft. She exhibits no distension.  Musculoskeletal: She exhibits no edema.  Neurological: She is alert and oriented to person, place, and time.  Skin: Skin is warm and dry.  Nursing note and vitals reviewed.   ED Course  Procedures (including critical care time) Labs Review  Labs Reviewed - No data to display  Imaging Review No results found. I have personally reviewed and evaluated these images and lab results as part of my medical decision-making.   EKG Interpretation None      MDM   Final diagnoses:  Breast mass, right    Patient presents with right breast mass which she notes has been increasing over the past week, endorses tenderness with palpation. Denies fever, nipple drainage, redness. VSS. Exam revealed approximately 1x2cm mobile mass to right breast at  5:00 region with mild tenderness to palpation, no surrounding redness, swelling, warmth, drainage, no nipple discharge. 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 I do not suspect abscess at this time. Plan to discharge patient home with symptomatic treatment. Patient given information for general surgery follow-up and advise patient to schedule a follow-up appointment for further evaluation. Discussed return precautions with patient.    Satira Sark Happy, [place] 01/23/16 1833  Laurence Spates, MD 01/23/16 1901

## 2016-01-23 NOTE — Discharge Instructions (Signed)
Call the general surgery clinic listed above to schedule a follow-up appointment for further evaluation of your right breast mass. Return to the emergency department if symptoms worsen or new onset of fever, redness, warmth, drainage, swelling.

## 2016-01-23 NOTE — ED Notes (Signed)
Patient c.o right breast lump x 1 week. Patient denies any drainage, but c/o tenderness when she touches it. No redness noted.

## 2016-01-30 ENCOUNTER — Encounter (HOSPITAL_COMMUNITY): Payer: Self-pay | Admitting: *Deleted

## 2016-01-30 ENCOUNTER — Inpatient Hospital Stay (HOSPITAL_COMMUNITY)
Admission: AD | Admit: 2016-01-30 | Discharge: 2016-01-30 | Disposition: A | Discharge status: Home / Self Care | Payer: No Typology Code available for payment source | Source: Ambulatory Visit | Attending: Obstetrics & Gynecology | Admitting: Obstetrics & Gynecology

## 2016-01-30 DIAGNOSIS — N6002 Solitary cyst of left breast: Secondary | ICD-10-CM | POA: Insufficient documentation

## 2016-01-30 DIAGNOSIS — F17210 Nicotine dependence, cigarettes, uncomplicated: Secondary | ICD-10-CM | POA: Insufficient documentation

## 2016-01-30 DIAGNOSIS — N63 Unspecified lump in unspecified breast: Secondary | ICD-10-CM

## 2016-01-30 LAB — URINALYSIS, ROUTINE W REFLEX MICROSCOPIC
Glucose, UA: NEGATIVE mg/dL
Ketones, ur: 15 mg/dL — AB
Leukocytes, UA: NEGATIVE
NITRITE: NEGATIVE
PROTEIN: NEGATIVE mg/dL
SPECIFIC GRAVITY, URINE: 1.025 (ref 1.005–1.030)
pH: 6 (ref 5.0–8.0)

## 2016-01-30 LAB — URINE MICROSCOPIC-ADD ON: Bacteria, UA: NONE SEEN

## 2016-01-30 LAB — POCT PREGNANCY, URINE: PREG TEST UR: NEGATIVE

## 2016-01-30 NOTE — Discharge Instructions (Signed)
Breast Cyst A breast cyst is a sac in the breast that is filled with fluid. Breast cysts are common in women. Women can have one or many cysts. When the breasts contain many cysts, it is usually due to a noncancerous (benign) condition called fibrocystic change. These lumps form under the influence of female hormones (estrogen and progesterone). The lumps are most often located in the upper, outer portion of the breast. They are often more swollen, painful, and tender before your period starts. They usually disappear after menopause, unless you are on hormone therapy.  There are several types of cysts:  Macrocyst. This is a cyst that is about 2 in. (5.1 cm) in diameter.   Microcyst. This is a tiny cyst that you cannot feel but can be seen with a mammogram or an ultrasound.   Galactocele. This is a cyst containing milk that may develop if you suddenly stop breastfeeding.   Sebaceous cyst of the skin. This type of cyst is not in the breast tissue itself. Breast cysts do not increase your risk of breast cancer. However, they must be monitored closely because they can be cancerous.  CAUSES  It is not known exactly what causes a breast cyst to form. Possible causes include:  An overgrowth of milk glands and connective tissue in the breast can block the milk glands, causing them to fill with fluid.   Scar tissue in the breast from previous surgery may block the glands, causing a cyst.  RISK FACTORS Estrogen may influence the development of a breast cyst.  SIGNS AND SYMPTOMS   Feeling a smooth, round, soft lump (like a grape) in the breast that is easily moveable.   Breast discomfort or pain.  Increase in size of the lump before your menstrual period and decrease in its size after your menstrual period.  DIAGNOSIS  A cyst can be felt during a physical exam by your health care provider. A breast X-ray exam (mammogram) and ultrasonography will be done to confirm the diagnosis. Fluid may  be removed from the cyst with a needle (fine needle aspiration) to make sure the cyst is not cancerous.  TREATMENT  Treatment may not be necessary. Your health care provider may monitor the cyst to see if it goes away on its own. If treatment is needed, it may include:  Hormone treatment.   Needle aspiration. There is a chance of the cyst coming back after aspiration.   Surgery to remove the whole cyst.  HOME CARE INSTRUCTIONS   Keep all follow-up appointments with your health care provider.  See your health care provider regularly:  Get a yearly exam by your health care provider.  Have a clinical breast exam by a health care provider every 1-3 years if you are 20-40 years of age. After age 40 years, you should have the exam every year.   Get mammogram tests as directed by your health care provider.   Understand the normal appearance and feel of your breasts and perform breast self-exams.   Only take over-the-counter or prescription medicines as directed by your health care provider.   Wear a supportive bra, especially when exercising.   Avoid caffeine.   Reduce your salt intake, especially before your menstrual period. Too much salt can cause fluid retention, breast swelling, and discomfort.  SEEK MEDICAL CARE IF:   You feel, or think you feel, a lump in your breast.   You notice that both breasts look or feel different than usual.   Your   breast is still causing pain after your menstrual period is over.   You need medicine for breast pain and swelling that occurs with your menstrual period.  SEEK IMMEDIATE MEDICAL CARE IF:   You have severe pain, tenderness, redness, or warmth in your breast.   You have nipple discharge or bleeding.   Your breast lump becomes hard and painful.   You find new lumps or bumps that were not there before.   You feel lumps in your armpit (axilla).   You notice dimpling or wrinkling of the breast or nipple.   You  have a fever.  MAKE SURE YOU:  Understand these instructions.  Will watch your condition.  Will get help right away if you are not doing well or get worse.   This information is not intended to replace advice given to you by your health care provider. Make sure you discuss any questions you have with your health care provider.   Document Released: 08/13/2005 Document Revised: 04/15/2013 Document Reviewed: 03/12/2013 Elsevier Interactive Patient Education 2016 [employer]  

## 2016-01-30 NOTE — MAU Note (Signed)
Lump in rt boob, first noted about 2 wks ago. Pt is not preg or breast feeding.   No nipple drainage.  Only hurts when you touch it.  Having black stools, first noted yesterday.

## 2016-01-30 NOTE — MAU Provider Note (Signed)
History     CSN: [id number]  Arrival date and time: 01/30/16 1528   None     No chief complaint on file.  HPI Samantha Carroll is 20 y.o. G0P0000 Presents for evaluation of a left breast lump that she first noticed 2 weeks ago.  Neg for nipple discharge or redness.  Only hurts if she touches it.  She is on her period now. She noticed her stool was dark yesterday.   No past medical history on file.  Past Surgical History  Procedure Laterality Date  . Wisdom tooth extraction    . Refractive surgery      RT eye  . Eye surgery      No family history on file.  Social History  Substance Use Topics  . Smoking status: Current Every Day Smoker -- 0.50 packs/day    Types: Cigarettes  . Smokeless tobacco: Never Used  . Alcohol Use: No    Allergies: No Known Allergies  Prescriptions prior to admission  Medication Sig Dispense Refill Last Dose  . methocarbamol (ROBAXIN) 500 MG tablet Take 1 tablet (500 mg total) by mouth 2 (two) times daily. (Patient not taking: Reported on 12/17/2015) 20 tablet 0   . naproxen (NAPROSYN) 500 MG tablet Take 1 tablet (500 mg total) by mouth 2 (two) times daily. (Patient not taking: Reported on 12/17/2015) 20 tablet 0   . oxyCODONE-acetaminophen (PERCOCET/ROXICET) 5-325 MG tablet 1 to 2 tabs PO q6hrs  PRN for pain (Patient not taking: Reported on 01/23/2016) 15 tablet 0     Review of Systems  Constitutional: Negative for fever and chills.  Genitourinary:       Left breast finding that is tender; neg for bil nipple discharge or redness.   She is on her period.  Skin: Negative for rash.   Physical Exam   Blood pressure 112/86, pulse 103, temperature 98.8 F (37.1 C), temperature source Oral, resp. rate 16, last menstrual period 01/27/2016.  Physical Exam  Nursing note and vitals reviewed. Constitutional: She is oriented to person, place, and time. She appears well-developed and well-nourished. No distress.  HENT:  Head: Normocephalic.  Neck:  Normal range of motion.  Respiratory: Effort normal. Right breast exhibits no inverted nipple, no mass, no nipple discharge, no skin change and no tenderness. Left breast exhibits mass and tenderness. Left breast exhibits no inverted nipple, no nipple discharge and no skin change. Breasts are symmetrical.  There is a nickel size, mobile tender area at 5 o'clock edge of breast in the left breast.  Appears to be cystic as opposed to solid.  Neg for abnormal findings in the right breast.    Neurological: She is alert and oriented to person, place, and time.  Skin: Skin is warm and dry.  Psychiatric: She has a normal mood and affect. Her behavior is normal. Thought content normal.   Results for orders placed or performed during the hospital encounter of 01/30/16 (from the past 24 hour(s))  Urinalysis, Routine w reflex microscopic (not [facility])     Status: Abnormal   Collection Time: 01/30/16  4:05 PM  Result Value Ref Range   Color, Urine YELLOW YELLOW   APPearance HAZY (A) CLEAR   Specific Gravity, Urine 1.025 1.005 - 1.030   pH 6.0 5.0 - 8.0   Glucose, UA NEGATIVE NEGATIVE mg/dL   Hgb urine dipstick MODERATE (A) NEGATIVE   Bilirubin Urine SMALL (A) NEGATIVE   Ketones, ur 15 (A) NEGATIVE mg/dL   Protein, ur NEGATIVE  NEGATIVE mg/dL   Nitrite NEGATIVE NEGATIVE   Leukocytes, UA NEGATIVE NEGATIVE  Urine microscopic-add on     Status: Abnormal   Collection Time: 01/30/16  4:05 PM  Result Value Ref Range   Squamous Epithelial / LPF 0-5 (A) NONE SEEN   WBC, UA 0-5 0 - 5 WBC/hpf   RBC / HPF 0-5 0 - 5 RBC/hpf   Bacteria, UA NONE SEEN NONE SEEN   Urine-Other MUCOUS PRESENT   Pregnancy, urine POC     Status: None   Collection Time: 01/30/16  4:13 PM  Result Value Ref Range   Preg Test, Ur NEGATIVE NEGATIVE   MAU Course  Procedures  MDM MSE Labs Exam Referral made to [facility]-  Assessment and Plan  A:  Left Breast cyst  P: Referral made to [facility].     Patient instructed to observe next  several bowel movements and if stool continues to be dark to see PCP.    Matt Holmes 01/30/2016, 5:42 PM

## 2016-02-03 ENCOUNTER — Telehealth: Payer: Self-pay | Admitting: *Deleted

## 2016-02-03 ENCOUNTER — Encounter: Payer: Self-pay | Admitting: Gynecology

## 2016-02-03 NOTE — Progress Notes (Unsigned)
[facility]  Martie LeeSabrina informed me that because she is under age 21, they cannot see her.  I called Alfonso Ramusaroline Hacker NP at [facility].  She has agreed to see her.  They will contact her for appointment. ===View-only below this line===  Jeani SowEve Randalyn Ahmed, RN FNP

## 2016-02-03 NOTE — Telephone Encounter (Signed)
TC to PT to schedule a new consult with Neysa Bonitohristy or Rayfield CitizenCaroline for a mass in her breast. Please schedule if the patient calls back.

## 2016-02-11 ENCOUNTER — Encounter (HOSPITAL_COMMUNITY): Payer: Self-pay | Admitting: Emergency Medicine

## 2016-02-11 ENCOUNTER — Emergency Department (HOSPITAL_COMMUNITY)
Admission: EM | Admit: 2016-02-11 | Discharge: 2016-02-11 | Disposition: A | Discharge status: Home / Self Care | Payer: Self-pay | Attending: Emergency Medicine | Admitting: Emergency Medicine

## 2016-02-11 ENCOUNTER — Emergency Department (HOSPITAL_COMMUNITY): Payer: Self-pay

## 2016-02-11 DIAGNOSIS — R109 Unspecified abdominal pain: Secondary | ICD-10-CM

## 2016-02-11 DIAGNOSIS — F17210 Nicotine dependence, cigarettes, uncomplicated: Secondary | ICD-10-CM | POA: Insufficient documentation

## 2016-02-11 DIAGNOSIS — N200 Calculus of kidney: Secondary | ICD-10-CM | POA: Insufficient documentation

## 2016-02-11 LAB — COMPREHENSIVE METABOLIC PANEL
ALK PHOS: 73 U/L (ref 38–126)
ALT: 10 U/L — AB (ref 14–54)
ANION GAP: 8 (ref 5–15)
AST: 17 U/L (ref 15–41)
Albumin: 4.2 g/dL (ref 3.5–5.0)
BUN: 6 mg/dL (ref 6–20)
CALCIUM: 9 mg/dL (ref 8.9–10.3)
CO2: 22 mmol/L (ref 22–32)
CREATININE: 0.76 mg/dL (ref 0.44–1.00)
Chloride: 107 mmol/L (ref 101–111)
Glucose, Bld: 126 mg/dL — ABNORMAL HIGH (ref 65–99)
Potassium: 3.6 mmol/L (ref 3.5–5.1)
SODIUM: 137 mmol/L (ref 135–145)
TOTAL PROTEIN: 7.3 g/dL (ref 6.5–8.1)
Total Bilirubin: 0.9 mg/dL (ref 0.3–1.2)

## 2016-02-11 LAB — URINE MICROSCOPIC-ADD ON

## 2016-02-11 LAB — CBC WITH DIFFERENTIAL/PLATELET
BASOS ABS: 0 10*3/uL (ref 0.0–0.1)
BASOS PCT: 1 %
EOS ABS: 0.2 10*3/uL (ref 0.0–0.7)
EOS PCT: 3 %
HCT: 39.1 % (ref 36.0–46.0)
Hemoglobin: 13.3 g/dL (ref 12.0–15.0)
Lymphocytes Relative: 40 %
Lymphs Abs: 2.9 10*3/uL (ref 0.7–4.0)
MCH: 29.8 pg (ref 26.0–34.0)
MCHC: 34 g/dL (ref 30.0–36.0)
MCV: 87.7 fL (ref 78.0–100.0)
Monocytes Absolute: 0.5 10*3/uL (ref 0.1–1.0)
Monocytes Relative: 6 %
NEUTROS PCT: 50 %
Neutro Abs: 3.7 10*3/uL (ref 1.7–7.7)
PLATELETS: 318 10*3/uL (ref 150–400)
RBC: 4.46 MIL/uL (ref 3.87–5.11)
RDW: 13.1 % (ref 11.5–15.5)
WBC: 7.3 10*3/uL (ref 4.0–10.5)

## 2016-02-11 LAB — LIPASE, BLOOD: LIPASE: 20 U/L (ref 11–51)

## 2016-02-11 LAB — URINALYSIS, ROUTINE W REFLEX MICROSCOPIC
BILIRUBIN URINE: NEGATIVE
Glucose, UA: NEGATIVE mg/dL
Ketones, ur: NEGATIVE mg/dL
NITRITE: NEGATIVE
PROTEIN: NEGATIVE mg/dL
SPECIFIC GRAVITY, URINE: 1.025 (ref 1.005–1.030)
pH: 6 (ref 5.0–8.0)

## 2016-02-11 LAB — I-STAT BETA HCG BLOOD, ED (MC, WL, AP ONLY)

## 2016-02-11 MED ORDER — OXYCODONE-ACETAMINOPHEN 5-325 MG PO TABS: 1.0000 | ORAL_TABLET | Freq: Four times a day (QID) | ORAL | Status: DC | PRN

## 2016-02-11 MED ORDER — ONDANSETRON 4 MG PO TBDP: 4.0000 mg | ORAL_TABLET | Freq: Three times a day (TID) | ORAL | Status: DC | PRN

## 2016-02-11 MED ORDER — NAPROXEN 500 MG PO TABS: 500.0000 mg | ORAL_TABLET | Freq: Two times a day (BID) | ORAL | Status: DC

## 2016-02-11 MED ORDER — FENTANYL CITRATE (PF) 100 MCG/2ML IJ SOLN
50.0000 ug | Freq: Once | INTRAMUSCULAR | Status: AC
  Administered 2016-02-11: 50 ug via INTRAVENOUS
  Filled 2016-02-11: qty 2

## 2016-02-11 MED ORDER — ONDANSETRON HCL 4 MG/2ML IJ SOLN
4.0000 mg | Freq: Once | INTRAMUSCULAR | Status: AC
  Administered 2016-02-11: 4 mg via INTRAVENOUS
  Filled 2016-02-11: qty 2

## 2016-02-11 NOTE — ED Notes (Signed)
Bed: [id number] Expected date: 02/11/16 Expected time: 12:08 PM Means of arrival: Ambulance Comments: ? Kidney stone

## 2016-02-11 NOTE — ED Provider Notes (Signed)
CSN: [id number]     Arrival date & time 02/11/16  1211 History   First MD Initiated Contact with Patient 02/11/16 1229     Chief Complaint  Patient presents with  . Flank Pain    L lower  . Emesis    HPI  Samantha Carroll is an 20 y.o. female withi history of kidney stones who presents to the ED for evaluation of left flank pain. She states the pain started yesterday, resolved on its own, and is back today and is much more severe. She states she has vomitted twice this morning due to the pain. She is tearful in the ED. She states she feels like she needs to urinate but can only go a few drops at a time. Denies diarrhea. States the pain feels like it is in her left abdomen and back. She states she has had kidney stones on the right side before and this feels like kidney stone pain. She has not tried anything thus far to alleviate her symptoms.   History reviewed. No pertinent past medical history. Past Surgical History  Procedure Laterality Date  . Wisdom tooth extraction    . Refractive surgery      RT eye  . Eye surgery     History reviewed. No pertinent family history. Social History  Substance Use Topics  . Smoking status: Current Every Day Smoker -- 0.50 packs/day    Types: Cigarettes  . Smokeless tobacco: Never Used  . Alcohol Use: No   OB History    Gravida Para Term Preterm AB TAB SAB Ectopic Multiple Living   0 0 0 0 0 0 0 0       Review of Systems  All other systems reviewed and are negative.     Allergies  Review of patient's allergies indicates no known allergies.  Home Medications   Prior to Admission medications   Not on File   BP 138/96 mmHg  Pulse 96  Temp(Src) 98 F (36.7 C) (Oral)  Resp 20  SpO2 96%  LMP 01/27/2016 Physical Exam  Constitutional: She is oriented to person, place, and time.  Tearful, clutching left side  HENT:  Right Ear: External ear normal.  Left Ear: External ear normal.  Nose: Nose normal.  Mouth/Throat: Oropharynx  is clear and moist. No oropharyngeal exudate.  Eyes: Conjunctivae and EOM are normal. Pupils are equal, round, and reactive to light.  Neck: Normal range of motion. Neck supple.  Cardiovascular: Normal rate, regular rhythm, normal heart sounds and intact distal pulses.   Pulmonary/Chest: Effort normal and breath sounds normal. No respiratory distress. She has no wheezes. She exhibits no tenderness.  Abdominal: Soft. Bowel sounds are normal. She exhibits no distension. There is no rebound and no guarding.  Marked left CVA tenderness Mild diffuse left sided abdominal tenderness without rebound or guarding  Musculoskeletal: She exhibits no edema.  Neurological: She is alert and oriented to person, place, and time. No cranial nerve deficit.  Skin: Skin is warm and dry.  Psychiatric: She has a normal mood and affect.  Nursing note and vitals reviewed.   ED Course  Procedures (including critical care time) Labs Review Labs Reviewed  URINALYSIS, ROUTINE W REFLEX MICROSCOPIC (NOT AT [facility]) - Abnormal; Notable for the following:    APPearance CLOUDY (*)    Hgb urine dipstick LARGE (*)    Leukocytes, UA TRACE (*)    All other components within normal limits  COMPREHENSIVE METABOLIC PANEL - Abnormal; Notable for the  following:    Glucose, Bld 126 (*)    ALT 10 (*)    All other components within normal limits  URINE MICROSCOPIC-ADD ON - Abnormal; Notable for the following:    Squamous Epithelial / LPF 6-30 (*)    Bacteria, UA MANY (*)    All other components within normal limits  URINE CULTURE  LIPASE, BLOOD  CBC WITH DIFFERENTIAL/PLATELET  I-STAT BETA HCG BLOOD, ED ([facility], [facility], [facility] ONLY)    Imaging Review Ct Renal Stone Study  02/11/2016  CLINICAL DATA:  Left flank pain. EXAM: CT ABDOMEN AND PELVIS WITHOUT CONTRAST TECHNIQUE: Multidetector CT imaging of the abdomen and pelvis was performed following the standard protocol without IV contrast. COMPARISON:  12/17/2015 FINDINGS: Lower chest:  No  acute findings. Hepatobiliary: No mass visualized on this un-enhanced exam. Pancreas: No mass or inflammatory process identified on this un-enhanced exam. Spleen: Within normal limits in size. Adrenals/Urinary Tract: No evidence of hydronephrosis. No definite mass visualized on this un-enhanced exam. There is a 4 mm nonobstructing calculus within the lower pole of the right kidney. Several other smaller wispy calculi are seen within the right and left kidneys. There is a 2 mm calculus within the urinary bladder at the expected location of the left trigone. No evidence of upstream hydroureter or left hydronephrosis. Stomach/Bowel: No evidence of obstruction, inflammatory process, or abnormal fluid collections. Vascular/Lymphatic: No pathologically enlarged lymph nodes. No evidence of abdominal aortic aneurysm. Reproductive: No mass or other significant abnormality. Other: None. Musculoskeletal:  No suspicious bone lesions identified. IMPRESSION: Tiny 2 mm calculus within the urinary bladder at the expected location of the left trigone. It is difficult to determine whether it is free floating within the dependent portion of the bladder or it is still embedded within the intravesicular portion of the left ureter. No evidence of obstructive uropathy. Bilateral nephrolithiasis. Wispy appearance of the calculi particular in the right kidney, query medullary nephrocalcinosis. Electronically Signed   By: Ted Mcalpineobrinka  Dimitrova M.D.   On: 02/11/2016 14:23   I have personally reviewed and evaluated these images and lab results as part of my medical decision-making.   EKG Interpretation None      MDM   Final diagnoses:  Left flank pain  Nephrolithiasis  Bilateral kidney stones    Pain improved in the ED. Pt is now able to urinate with no issues.  Urine with evidence of contamination, no WBC. Prior to today she had no urinary symptoms. Will hold off on abx at this time and send for culture. Labs otherwise  unremarkable and pt feeling well. 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 Pt states she has been trying to get the finances together to see urology. She is currently nontoxic appearing and hemodynamically stable with good pain control in the ED. We will refer to urology when pt is financially able. In the meantime rx given for pain meds and anti-emetics. ER return precautions given.    Carlene CoriaSerena Y Kayani Rapaport, PA-C 02/11/16 1657  Doug SouSam Jacubowitz, MD 02/11/16 1726

## 2016-02-11 NOTE — ED Notes (Signed)
Pt c/o L flank pain, arrived to the ED vomiting. States pain started yesterday, went away last night and back again today.

## 2016-02-11 NOTE — ED Notes (Signed)
Patient transported to CT 

## 2016-02-11 NOTE — Discharge Instructions (Signed)
Please follow up with urology when you are able to. Take pain medications as prescribed as needed for pain. Return to the ER for new or worsening symptoms.

## 2016-02-13 LAB — URINE CULTURE

## 2016-05-25 ENCOUNTER — Encounter (HOSPITAL_COMMUNITY): Payer: Self-pay | Admitting: Emergency Medicine

## 2016-05-25 ENCOUNTER — Emergency Department (HOSPITAL_COMMUNITY)
Admission: EM | Admit: 2016-05-25 | Discharge: 2016-05-25 | Disposition: A | Discharge status: Home / Self Care | Payer: Medicaid Other | Attending: Physician Assistant | Admitting: Physician Assistant

## 2016-05-25 DIAGNOSIS — F17210 Nicotine dependence, cigarettes, uncomplicated: Secondary | ICD-10-CM | POA: Insufficient documentation

## 2016-05-25 DIAGNOSIS — N63 Unspecified lump in unspecified breast: Secondary | ICD-10-CM

## 2016-05-25 NOTE — ED Provider Notes (Signed)
[facility]-EMERGENCY DEPT Provider Note   CSN: [id number] Arrival date & time: 05/25/16  1617  By signing my name below, I, Emmanuella Mensah, attest that this documentation has been prepared under the direction and in the presence of [facility] Almas Rake, PA-C. Electronically Signed: Angelene GiovanniEmmanuella Mensah, ED Scribe. 05/25/16. 4:48 PM.   History   Chief Complaint Chief Complaint  Patient presents with  . Breast Mass   HPI Comments: Samantha Carroll is a 20 y.o. female who presents to the Emergency Department complaining of gradual onset, persistently painful constant "lump" under her right breast onset 6 months ago. She reports that she also has intermittent episodes of these "lumps" to her bilateral axilla onset one month ago but they are not present today. She denies any discharge or redness to the lumps. No alleviating factors noted. Pt has not tried any medications PTA. No correlation with her menstrual cycle. She explains that [facility] these symptoms and was advised to follow up with diagnostic breasts center. She states that she has not been able to follow up with them due to financial constraints.     The history is provided by the patient. No language interpreter was used.    History reviewed. No pertinent past medical history.  Patient Active Problem List   Diagnosis Date Noted  . Adjustment disorder with depressed mood 03/17/2014    Past Surgical History:  Procedure Laterality Date  . EYE SURGERY    . REFRACTIVE SURGERY     RT eye  . WISDOM TOOTH EXTRACTION      OB History    Gravida Para Term Preterm AB Living   0 0 0 0 0     SAB TAB Ectopic Multiple Live Births   0 0 0           Home Medications    Prior to Admission medications   Medication Sig Start Date End Date Taking? Authorizing Provider  naproxen (NAPROSYN) 500 MG tablet Take 1 tablet (500 mg total) by mouth 2 (two) times daily. 02/11/16   Ace GinsSerena Y Sam, PA-C  ondansetron  (ZOFRAN ODT) 4 MG disintegrating tablet Take 1 tablet (4 mg total) by mouth every 8 (eight) hours as needed for nausea or vomiting. 02/11/16   Ace GinsSerena Y Sam, PA-C  oxyCODONE-acetaminophen (PERCOCET/ROXICET) 5-325 MG tablet Take 1-2 tablets by mouth every 6 (six) hours as needed for severe pain. 02/11/16   Carlene CoriaSerena Y Sam, PA-C    Family History No family history on file.  Social History Social History  Substance Use Topics  . Smoking status: Current Every Day Smoker    Packs/day: 0.50    Types: Cigarettes  . Smokeless tobacco: Never Used  . Alcohol use No     Allergies   Review of patient's allergies indicates no known allergies.   Review of Systems Review of Systems  Constitutional: Negative for chills and fever.  Genitourinary:       + breast nodule  Skin: Negative for color change and rash.     Physical Exam Updated Vital Signs BP 123/76 (BP Location: Left Arm)   Pulse 84   Temp 98.2 F (36.8 C) (Oral)   Resp 16   SpO2 100%   Physical Exam  Constitutional: She is oriented to person, place, and time. She appears well-developed and well-nourished. No distress.  HENT:  Head: Normocephalic and atraumatic.  Cardiovascular: Normal rate, regular rhythm and normal heart sounds.   Pulmonary/Chest: Effort normal and breath  sounds normal. No respiratory distress. She has no rales. Right breast exhibits no nipple discharge and no tenderness.    Musculoskeletal: She exhibits no edema.  Neurological: She is alert and oriented to person, place, and time.  Skin: Skin is warm and dry.  Nursing note and vitals reviewed.  ED Treatments / Results  DIAGNOSTIC STUDIES: Oxygen Saturation is 100% on RA, normal by my interpretation.    COORDINATION OF CARE: 4:47 PM- Pt advised of plan for treatment and pt agrees.   Labs (all labs ordered are listed, but only abnormal results are displayed) Labs Reviewed - No data to display  EKG  EKG Interpretation None       Radiology No  results found.  Procedures Procedures (including critical care time)  Medications Ordered in ED Medications - No data to display   Initial Impression / Assessment and Plan / ED Course  Elizabeth Sauer, PA-C has reviewed the triage vital signs and the nursing notes.  Pertinent labs & imaging results that were available during my care of the patient were reviewed by me and considered in my medical decision making (see chart for details).  Clinical Course   Samantha Carroll presents to ED for breast nodule x 6 months. Seen at [facility] hospital for same and told to follow up with breast center for imaging, however unable to do this due to financial reasons. I discussed with patient that I do strongly believe she needs this imaging performed, but unfortunately it is not an emergent condition that would warrant [place] to order ultrasound in ED today. Breast imaging information given and patient understands importance of follow up care. Reasons to return to ED discussed and all questions answered.   Final Clinical Impressions(s) / ED Diagnoses   Final diagnoses:  Breast nodule    New Prescriptions Discharge Medication List as of 05/25/2016  5:12 PM     I personally performed the services described in this documentation, which was scribed in my presence. The recorded information has been reviewed and is accurate.    Chase Picket Robbie Nangle, PA-C 05/25/16 1736    Courteney Randall An, MD 05/26/16 2104

## 2016-05-25 NOTE — Discharge Instructions (Signed)
Please follow up with the breast center for imaging as we discussed.  Return to ER for new or worsening symptoms, any additional concerns.

## 2016-05-25 NOTE — ED Triage Notes (Signed)
pt states she noticed lumps under breast about 6 month ago. States the are painful with touch. Pt states she noticed lump under arms about a month ago. States they are painful with any movement of arms.

## 2016-06-11 ENCOUNTER — Ambulatory Visit: Payer: No Typology Code available for payment source | Attending: Oncology

## 2016-06-11 ENCOUNTER — Encounter: Payer: Self-pay | Admitting: *Deleted

## 2016-06-11 VITALS — BP 110/77 | HR 79 | Temp 98.1°F | Ht 68.9 in | Wt 154.5 lb

## 2016-06-11 DIAGNOSIS — N63 Unspecified lump in unspecified breast: Secondary | ICD-10-CM

## 2016-06-11 NOTE — Progress Notes (Signed)
Subjective:     Patient ID: Samantha Carroll, female   DOB: Jul 24, 1996, 20 y.o.   MRN: [id number]  HPI   Review of Systems     Objective:   Physical Exam  Pulmonary/Chest: Right breast exhibits mass. Right breast exhibits no inverted nipple, no nipple discharge, no skin change and no tenderness. Left breast exhibits no inverted nipple, no mass, no nipple discharge, no skin change and no tenderness. Breasts are symmetrical.         Assessment:     [age] yar old female referred to [facility] by Dr. Lacie ScottsNiemeyer for right breast "cyst".  Patient screened, and meets [facility] eligibility.  Patient does not have insurance, Medicare or Medicaid.  Handout given on Affordable Care Act. Instructed patient on breast self-exam using teach back method.  Patient reports she has felt lump in right breast since April 2017.  States it was tender initially, but now only if pressure applied.  Palpated a thickening superior to inframammary ridge right breast at 5 o'clock, more prominent than left breast.  Patient's father accompanied her.    Plan:     Scheduled right breast ultrasound for 06/19/16.

## 2016-06-19 ENCOUNTER — Ambulatory Visit
Admission: RE | Admit: 2016-06-19 | Discharge: 2016-06-19 | Disposition: A | Discharge status: Home / Self Care | Payer: Medicaid Other | Source: Ambulatory Visit | Attending: Oncology | Admitting: Oncology

## 2016-06-19 ENCOUNTER — Other Ambulatory Visit: Payer: Self-pay

## 2016-06-19 DIAGNOSIS — N63 Unspecified lump in unspecified breast: Secondary | ICD-10-CM

## 2016-06-19 HISTORY — DX: Unspecified lump in unspecified breast: N63.0

## 2016-06-19 NOTE — Progress Notes (Addendum)
Patient had Birads 3 result on right breast ultrasound today.  She is scheduled for 6 moth follow-up right breast ultrasound on  Wednesday 12/19/16 at 0920.  Mailed appointment notification.  Copy to [facility].

## 2016-11-10 IMAGING — CT CT RENAL STONE PROTOCOL
2 of 3 series · 16 of 46 positions shown, 18 images · non-contrast
Comparison: 12/17/2015

CLINICAL DATA: Left flank pain.

EXAM:
CT ABDOMEN AND PELVIS WITHOUT CONTRAST
TECHNIQUE: Multidetector CT imaging of the abdomen and pelvis was performed
following the standard protocol without IV contrast.

[Series 4: lung · axial · 0.61mm/px · z∈[-125,-57]mm · 13 of 40 slices shown, 15 images]
[im 3/40  soft-tissue]
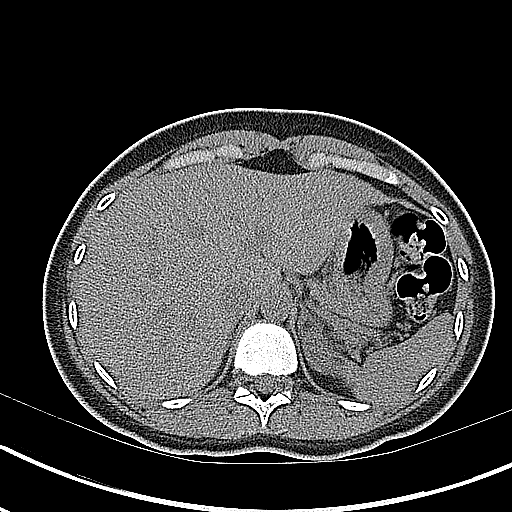
[im 3/40  bone]
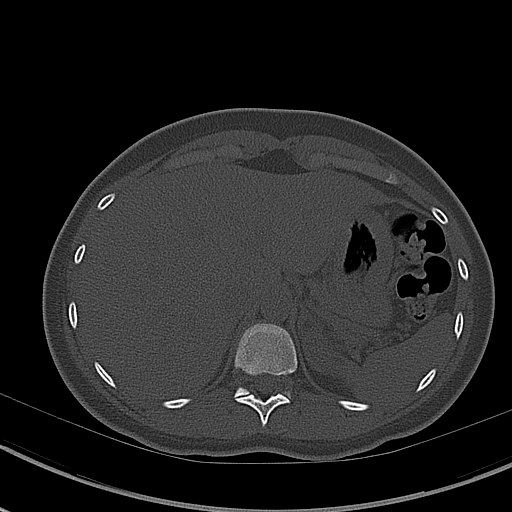
[im 6/40  soft-tissue]
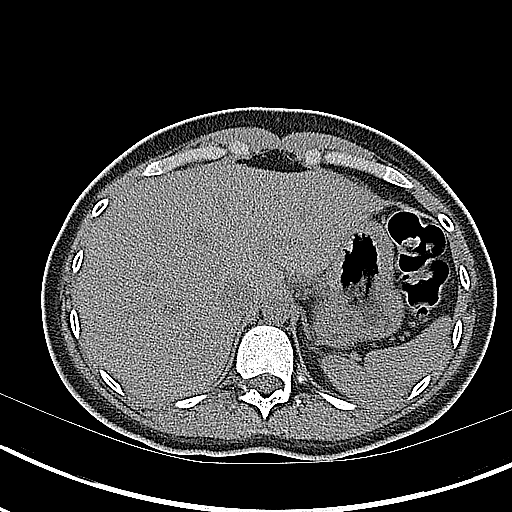
[im 8/40  soft-tissue]
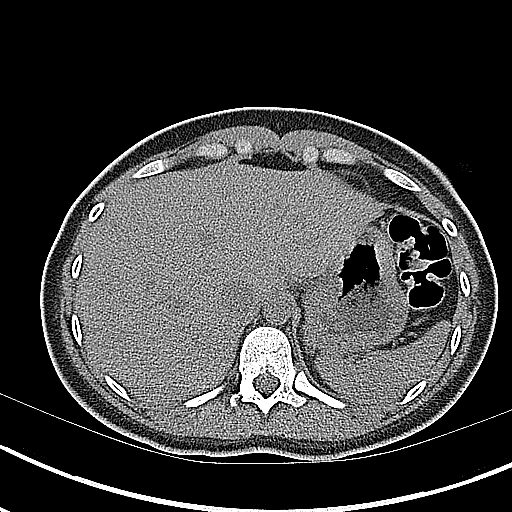
[im 12/40  soft-tissue]
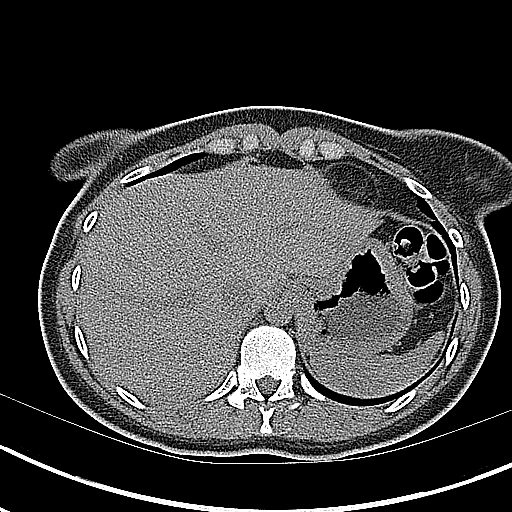
[im 14/40  soft-tissue]
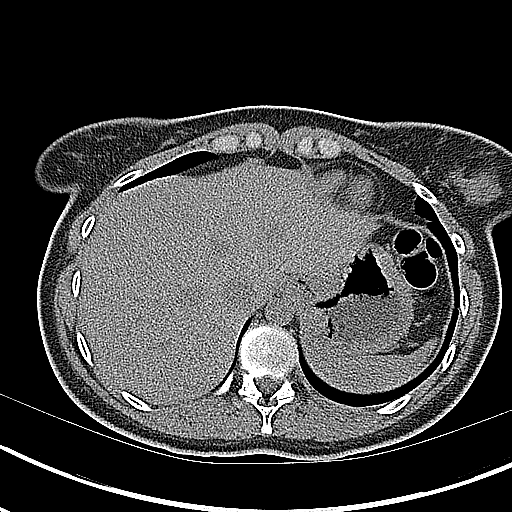
[im 17/40  soft-tissue]
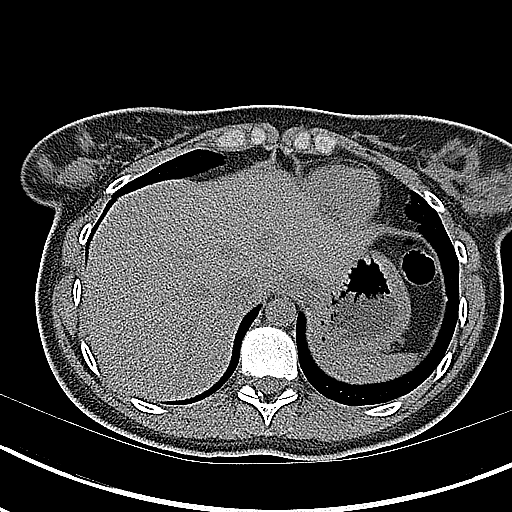
[im 21/40  soft-tissue]
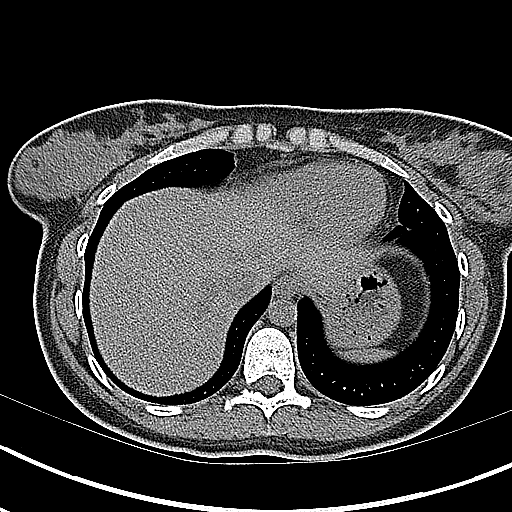
[im 23/40  soft-tissue]
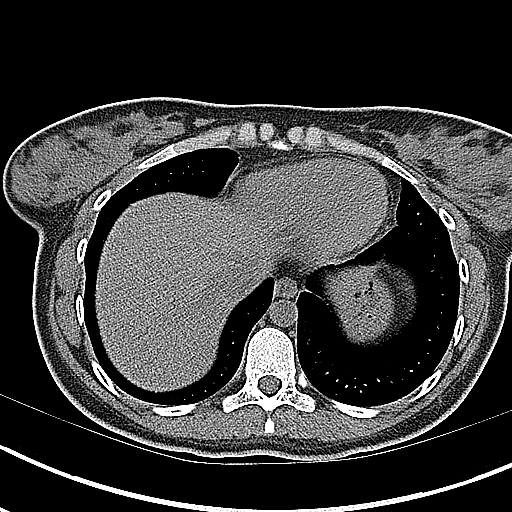
[im 26/40  soft-tissue]
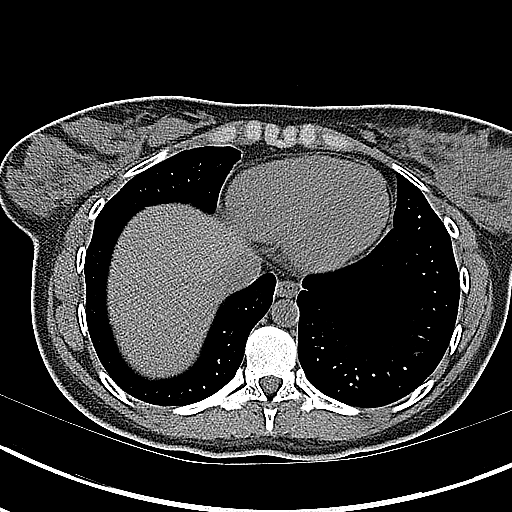
[im 26/40  bone]
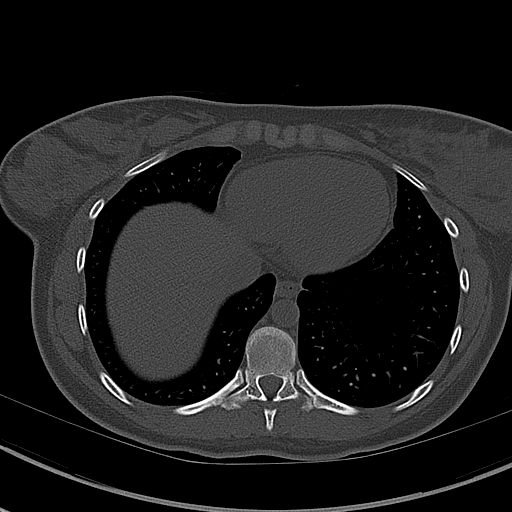
[im 28/40  soft-tissue]
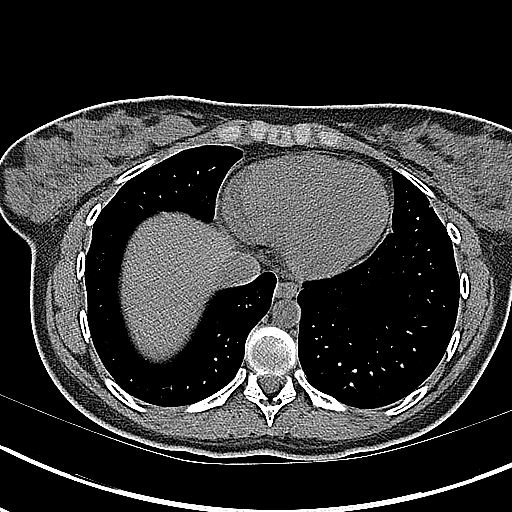
[im 32/40  soft-tissue]
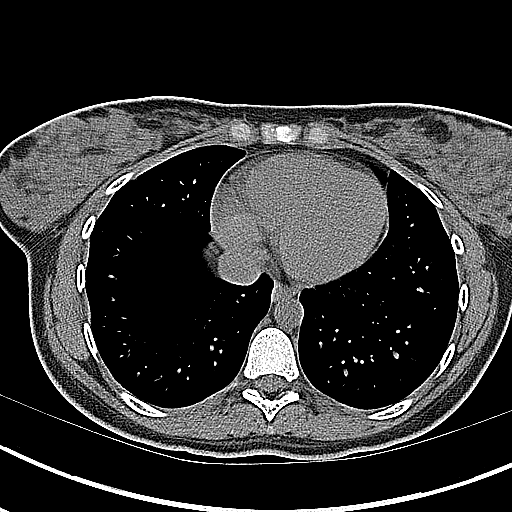
[im 34/40  soft-tissue]
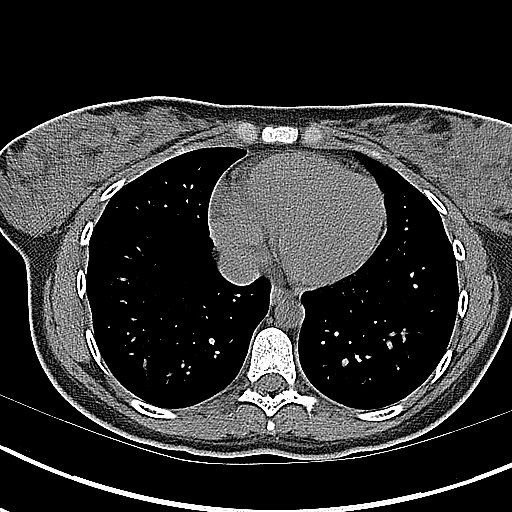
[im 37/40  soft-tissue]
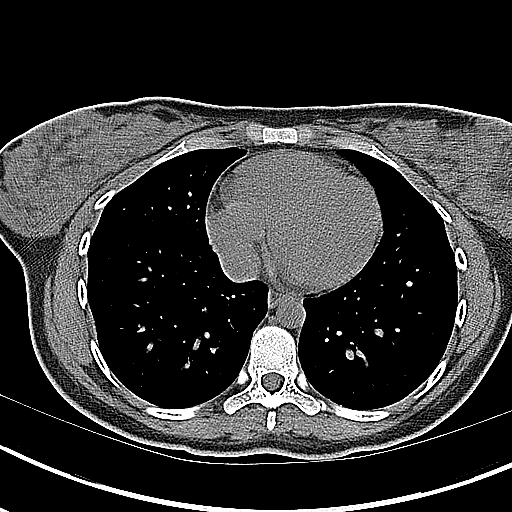

[Series 5: coronal · coronal · 0.74mm/px · 3 of 109 slices shown]
[im 37/109  soft-tissue]
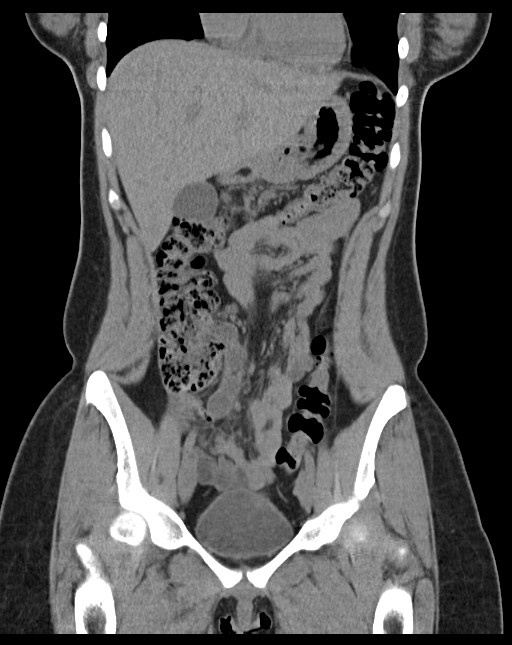
[im 49/109  soft-tissue]
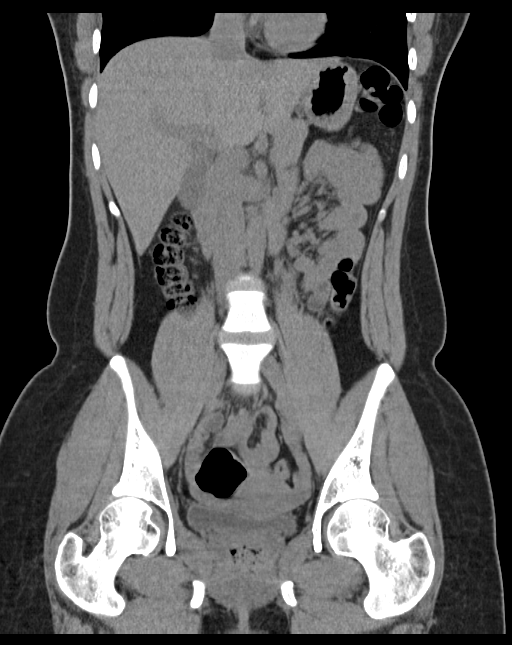
[im 61/109  soft-tissue]
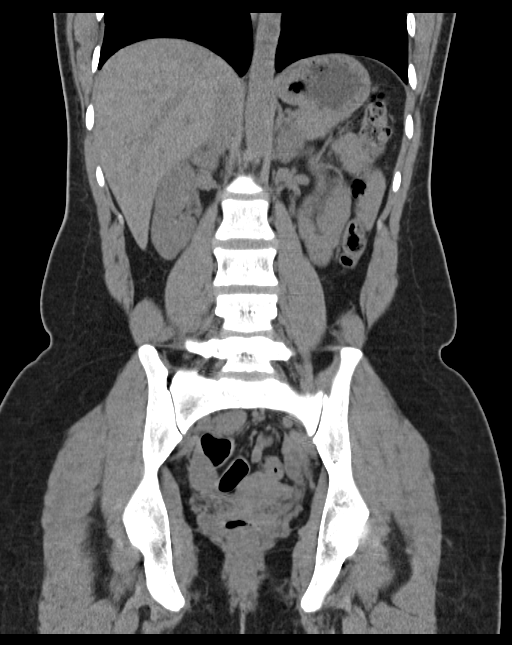

[16 of 46 positions shown; findings below may reference images not displayed]

FINDINGS: Lower chest:  No acute findings.

Hepatobiliary: No mass visualized on this un-enhanced exam.

Pancreas: No mass or inflammatory process identified on this
un-enhanced exam.

Spleen: Within normal limits in size.

Adrenals/Urinary Tract: No evidence of hydronephrosis. No definite
mass visualized on this un-enhanced exam. There is a 4 mm
nonobstructing calculus within the lower pole of the right kidney.
Several other smaller wispy calculi are seen within the right and
left kidneys. There is a 2 mm calculus within the urinary bladder at
the expected location of the left trigone. No evidence of upstream
hydroureter or left hydronephrosis.

Stomach/Bowel: No evidence of obstruction, inflammatory process, or
abnormal fluid collections.

Vascular/Lymphatic: No pathologically enlarged lymph nodes. No
evidence of abdominal aortic aneurysm.

Reproductive: No mass or other significant abnormality.

Other: None.

Musculoskeletal:  No suspicious bone lesions identified.
IMPRESSION: Tiny 2 mm calculus within the urinary bladder at the expected
location of the left trigone. It is difficult to determine whether
it is free floating within the dependent portion of the bladder or
it is still embedded within the intravesicular portion of the left
ureter. No evidence of obstructive uropathy.

Bilateral nephrolithiasis. Wispy appearance of the calculi
particular in the right kidney, query medullary nephrocalcinosis.

## 2016-12-19 ENCOUNTER — Ambulatory Visit: Payer: No Typology Code available for payment source | Attending: Oncology

## 2016-12-19 ENCOUNTER — Ambulatory Visit: Payer: Self-pay | Attending: Oncology

## 2017-06-17 DIAGNOSIS — Z87442 Personal history of urinary calculi: Secondary | ICD-10-CM | POA: Insufficient documentation

## 2019-09-28 ENCOUNTER — Other Ambulatory Visit: Payer: Self-pay

## 2019-09-28 ENCOUNTER — Encounter: Payer: Self-pay | Admitting: Emergency Medicine

## 2019-09-28 ENCOUNTER — Emergency Department
Admission: EM | Admit: 2019-09-28 | Discharge: 2019-09-28 | Disposition: A | Discharge status: Home / Self Care | Payer: Medicaid Other | Attending: Emergency Medicine | Admitting: Emergency Medicine

## 2019-09-28 DIAGNOSIS — F17210 Nicotine dependence, cigarettes, uncomplicated: Secondary | ICD-10-CM | POA: Insufficient documentation

## 2019-09-28 DIAGNOSIS — N760 Acute vaginitis: Secondary | ICD-10-CM | POA: Diagnosis not present

## 2019-09-28 DIAGNOSIS — B9689 Other specified bacterial agents as the cause of diseases classified elsewhere: Secondary | ICD-10-CM

## 2019-09-28 DIAGNOSIS — R1030 Lower abdominal pain, unspecified: Secondary | ICD-10-CM | POA: Diagnosis present

## 2019-09-28 LAB — URINALYSIS, COMPLETE (UACMP) WITH MICROSCOPIC
Bilirubin Urine: NEGATIVE
Glucose, UA: NEGATIVE mg/dL
Hgb urine dipstick: NEGATIVE
Ketones, ur: NEGATIVE mg/dL
Nitrite: NEGATIVE
Protein, ur: 30 mg/dL — AB
Specific Gravity, Urine: 1.03 (ref 1.005–1.030)
pH: 7 (ref 5.0–8.0)

## 2019-09-28 LAB — COMPREHENSIVE METABOLIC PANEL
ALT: 10 U/L (ref 0–44)
AST: 12 U/L — ABNORMAL LOW (ref 15–41)
Albumin: 3.8 g/dL (ref 3.5–5.0)
Alkaline Phosphatase: 78 U/L (ref 38–126)
Anion gap: 9 (ref 5–15)
BUN: 11 mg/dL (ref 6–20)
CO2: 23 mmol/L (ref 22–32)
Calcium: 8.6 mg/dL — ABNORMAL LOW (ref 8.9–10.3)
Chloride: 106 mmol/L (ref 98–111)
Creatinine, Ser: 0.68 mg/dL (ref 0.44–1.00)
GFR calc Af Amer: >60 mL/min (ref >60)
GFR calc non Af Amer: >60 mL/min (ref >60)
Glucose, Bld: 101 mg/dL — ABNORMAL HIGH (ref 70–99)
Potassium: 4 mmol/L (ref 3.5–5.1)
Sodium: 138 mmol/L (ref 135–145)
Total Bilirubin: 0.4 mg/dL (ref 0.3–1.2)
Total Protein: 7.4 g/dL (ref 6.5–8.1)

## 2019-09-28 LAB — WET PREP, GENITAL
Sperm: NONE SEEN
Trich, Wet Prep: NONE SEEN
Yeast Wet Prep HPF POC: NONE SEEN

## 2019-09-28 LAB — CBC
HCT: 34.4 % — ABNORMAL LOW (ref 36.0–46.0)
Hemoglobin: 10.6 g/dL — ABNORMAL LOW (ref 12.0–15.0)
MCH: 25.9 pg — ABNORMAL LOW (ref 26.0–34.0)
MCHC: 30.8 g/dL (ref 30.0–36.0)
MCV: 84.1 fL (ref 80.0–100.0)
Platelets: 342 10*3/uL (ref 150–400)
RBC: 4.09 MIL/uL (ref 3.87–5.11)
RDW: 15.5 % (ref 11.5–15.5)
WBC: 11.1 10*3/uL — ABNORMAL HIGH (ref 4.0–10.5)
nRBC: 0 % (ref 0.0–0.2)

## 2019-09-28 LAB — LIPASE, BLOOD: Lipase: 20 U/L (ref 11–51)

## 2019-09-28 LAB — POCT PREGNANCY, URINE: Preg Test, Ur: NEGATIVE

## 2019-09-28 MED ORDER — IBUPROFEN 600 MG PO TABS: 600.0000 mg | ORAL_TABLET | Freq: Four times a day (QID) | ORAL | 0 refills | Status: DC | PRN

## 2019-09-28 MED ORDER — METRONIDAZOLE 500 MG PO TABS: 500.0000 mg | ORAL_TABLET | Freq: Two times a day (BID) | ORAL | 0 refills | Status: AC

## 2019-09-28 MED ORDER — METRONIDAZOLE 500 MG PO TABS
500.0000 mg | ORAL_TABLET | Freq: Once | ORAL | Status: AC
  Administered 2019-09-28: 17:00:00 500 mg via ORAL
  Filled 2019-09-28: qty 1

## 2019-09-28 NOTE — ED Notes (Signed)
This RN performed POC UA Preg. Attempted to log with Glucometer but results not crossing over. Results negative.

## 2019-09-28 NOTE — Discharge Instructions (Signed)
Take the antibiotic as prescribed and finish the full 7-day course.  You may take the ibuprofen as needed for pain.  You will receive a phone call if any of the additional tests for infections that were taken from the vaginal swab are positive.  Return to the ER for new, worsening, or persistent severe pain, discharge, bleeding, problems urinating, fever, or any other new or worsening symptoms that concern you.

## 2019-09-28 NOTE — ED Notes (Signed)
Pelvic cart at bedside. 

## 2019-09-28 NOTE — ED Triage Notes (Signed)
Pt in via POV, reports generalized abdominal pain, vaginal pain and rectal pain.  Denies any abnormal bleeding or discharge, denies N/V/D.  Ambulatory to triage, NAD noted at this time.

## 2019-09-28 NOTE — ED Notes (Addendum)
Pt c/o bilateral lower abdominal pain, vaginal pain and rectal pain that started yesterday. Pt denies rectal or vaginal bleeding. Pt denies burning with urination or abnormal discharge. Pt states she had a abortion in 2020.

## 2019-09-28 NOTE — ED Triage Notes (Signed)
First Nurse Note:   C/O pelvic and vaginal pain x 1 day.  AAOx3.  Skin warm and dry. NAD

## 2019-09-28 NOTE — ED Provider Notes (Signed)
[facility] Emergency Department Provider Note ____________________________________________   First MD Initiated Contact with Patient 09/28/19 1402     (approximate)  I have reviewed the triage vital signs and the nursing notes.   HISTORY  Chief Complaint Abdominal Pain    HPI Samantha Carroll is a 24 y.o. female with PMH as noted below who presents with suprapubic abdominal pain over the last day, nonradiating, and associated with some pain in her vaginal area and rectum.  She reports some clear vaginal discharge.  She denies dysuria, difficulty with bowel movements, fever, or any nausea or vomiting.  The patient states that she is sexually active with one partner.  Past Medical History:  Diagnosis Date  . Breast mass     Patient Active Problem List   Diagnosis Date Noted  . Adjustment disorder with depressed mood 03/17/2014    Past Surgical History:  Procedure Laterality Date  . EYE SURGERY    . REFRACTIVE SURGERY     RT eye  . WISDOM TOOTH EXTRACTION      Prior to Admission medications   Medication Sig Start Date End Date Taking? Authorizing Provider  ibuprofen (ADVIL) 600 MG tablet Take 1 tablet (600 mg total) by mouth every 6 (six) hours as needed. 09/28/19   Arta Silence, MD  metroNIDAZOLE (FLAGYL) 500 MG tablet Take 1 tablet (500 mg total) by mouth 2 (two) times daily for 7 days. 09/28/19 10/05/19  Arta Silence, MD  naproxen (NAPROSYN) 500 MG tablet Take 1 tablet (500 mg total) by mouth 2 (two) times daily. 02/11/16   Sam, Olivia Canter, PA-C  ondansetron (ZOFRAN ODT) 4 MG disintegrating tablet Take 1 tablet (4 mg total) by mouth every 8 (eight) hours as needed for nausea or vomiting. 02/11/16   Sam, Olivia Canter, PA-C  oxyCODONE-acetaminophen (PERCOCET/ROXICET) 5-325 MG tablet Take 1-2 tablets by mouth every 6 (six) hours as needed for severe pain. 02/11/16   Sam, Olivia Canter, PA-C    Allergies Patient has no known allergies.  Family  History  Problem Relation Age of Onset  . Brain cancer Father   . Breast cancer Cousin     Social History Social History   Tobacco Use  . Smoking status: Current Every Day Smoker    Packs/day: 0.50    Types: Cigarettes  . Smokeless tobacco: Never Used  Substance Use Topics  . Alcohol use: No  . Drug use: No    Review of Systems  Constitutional: No fever/chills. Eyes: No redness. ENT: No sore throat. Cardiovascular: Denies chest pain. Respiratory: Denies shortness of breath. Gastrointestinal: No vomiting or diarrhea.  Genitourinary: Negative for dysuria.  Positive for vaginal discharge. Musculoskeletal: Negative for back pain. Skin: Negative for rash. Neurological: Negative for headache.   ____________________________________________   PHYSICAL EXAM:  VITAL SIGNS: ED Triage Vitals  Enc Vitals Group     BP 09/28/19 1216 127/71     Pulse Rate 09/28/19 1216 88     Resp 09/28/19 1216 16     Temp 09/28/19 1216 98.3 F (36.8 C)     Temp Source 09/28/19 1216 Oral     SpO2 09/28/19 1216 99 %     Weight 09/28/19 1217 180 lb (81.6 kg)     Height 09/28/19 1217 5\' 8"  (1.727 m)     Head Circumference --      Peak Flow --      Pain Score 09/28/19 1216 8     Pain Loc --  Pain Edu? --      Excl. in GC? --     Constitutional: Alert and oriented. Well appearing and in no acute distress. Eyes: Conjunctivae are normal.  Head: Atraumatic. Nose: No congestion/rhinnorhea. Mouth/Throat: Mucous membranes are moist.   Neck: Normal range of motion.  Cardiovascular: Normal rate, regular rhythm. Good peripheral circulation. Respiratory: Normal respiratory effort.  No retractions.  Gastrointestinal: Soft with mild suprapubic tenderness.  No distention.  No significant findings on external rectal exam. Genitourinary: Normal external genitalia.  Moderate amount of clear/yellow discharge.  No significant CMT or adnexal tenderness. Musculoskeletal: Extremities warm and well  perfused.  Neurologic:  Normal speech and language. No gross focal neurologic deficits are appreciated.  Skin:  Skin is warm and dry. No rash noted. Psychiatric: Mood and affect are normal. Speech and behavior are normal.  ____________________________________________   LABS (all labs ordered are listed, but only abnormal results are displayed)  Labs Reviewed  WET PREP, GENITAL - Abnormal; Notable for the following components:      Result Value   Clue Cells Wet Prep HPF POC PRESENT (*)    WBC, Wet Prep HPF POC MANY (*)    All other components within normal limits  COMPREHENSIVE METABOLIC PANEL - Abnormal; Notable for the following components:   Glucose, Bld 101 (*)    Calcium 8.6 (*)    AST 12 (*)    All other components within normal limits  CBC - Abnormal; Notable for the following components:   WBC 11.1 (*)    Hemoglobin 10.6 (*)    HCT 34.4 (*)    MCH 25.9 (*)    All other components within normal limits  URINALYSIS, COMPLETE (UACMP) WITH MICROSCOPIC - Abnormal; Notable for the following components:   Color, Urine YELLOW (*)    APPearance HAZY (*)    Protein, ur 30 (*)    Leukocytes,Ua MODERATE (*)    Bacteria, UA RARE (*)    All other components within normal limits  GC/CHLAMYDIA PROBE AMP  LIPASE, BLOOD  POC URINE PREG, ED  POCT PREGNANCY, URINE   ____________________________________________  EKG   ____________________________________________  RADIOLOGY    ____________________________________________   PROCEDURES  Procedure(s) performed: No  Procedures  Critical Care performed: No ____________________________________________   INITIAL IMPRESSION / ASSESSMENT AND PLAN / ED COURSE  Pertinent labs & imaging results that were available during my care of the patient were reviewed by me and considered in my medical decision making (see chart for details).  24 year old female with PMH as noted above presents with suprapubic abdominal pain since  yesterday along with some vaginal and anal area discomfort and vaginal discharge but no dysuria.  She reports normal bowel movements, no nausea or vomiting, no fever.  On exam, the patient is overall well-appearing and has some mild suprapubic tenderness.  GU exam is unremarkable except for moderate clear/yellow type discharge, but no significant CMT or adnexal tenderness.  External rectal exam is unremarkable.  Differential includes BV, trichomonas, or less likely other STI.  I have a lower suspicion for UTI, given that the patient has no dysuria.  We will obtain a wet prep and reassess.  ----------------------------------------- 5:23 PM on 09/28/2019 -----------------------------------------  UA shows WBCs and some bacteria, but no nitrites so this is fairly nonspecific.  Wet prep shows WBCs and clue cells, which along with the nature of the discharge is more suggestive of BV.  At this time, the patient is stable for discharge home.  I have prescribed  her Flagyl and given a dose in the ED.  I counseled her on the results of the work-up and the likely causes of her symptoms.  She understands that the GC/CT probe is pending.  Return precautions given, and she expresses understanding.  ____________________________________________   FINAL CLINICAL IMPRESSION(S) / ED DIAGNOSES  Final diagnoses:  Bacterial vaginosis      NEW MEDICATIONS STARTED DURING THIS VISIT:  Discharge Medication List as of 09/28/2019  4:48 PM    START taking these medications   Details  ibuprofen (ADVIL) 600 MG tablet Take 1 tablet (600 mg total) by mouth every 6 (six) hours as needed., Starting Mon 09/28/2019, Print    metroNIDAZOLE (FLAGYL) 500 MG tablet Take 1 tablet (500 mg total) by mouth 2 (two) times daily for 7 days., Starting Mon 09/28/2019, Until Mon 10/05/2019, Print         Note:  This document was prepared using Dragon voice recognition software and may include unintentional dictation errors.      Dionne Bucy, MD 09/28/19 1724

## 2019-10-02 ENCOUNTER — Telehealth: Payer: Self-pay | Admitting: Emergency Medicine

## 2019-10-02 NOTE — Telephone Encounter (Signed)
Called patient and informed of std results, need for treatment and partner treatment.  Gave options for care-- pcp,urgent care, achd or ED.

## 2019-10-05 ENCOUNTER — Other Ambulatory Visit: Payer: Self-pay

## 2019-10-05 ENCOUNTER — Encounter: Payer: Medicaid Other | Admitting: Family Medicine

## 2019-10-05 NOTE — Progress Notes (Signed)
Pt left without being seen by provider or RN.

## 2019-10-05 NOTE — Progress Notes (Signed)
This encounter was created in error - please disregard.

## 2019-10-06 ENCOUNTER — Encounter: Payer: Self-pay | Admitting: Family Medicine

## 2019-10-06 ENCOUNTER — Ambulatory Visit: Payer: Medicaid Other | Admitting: Family Medicine

## 2019-10-06 DIAGNOSIS — F329 Major depressive disorder, single episode, unspecified: Secondary | ICD-10-CM

## 2019-10-06 DIAGNOSIS — F419 Anxiety disorder, unspecified: Secondary | ICD-10-CM

## 2019-10-06 DIAGNOSIS — Z113 Encounter for screening for infections with a predominantly sexual mode of transmission: Secondary | ICD-10-CM

## 2019-10-06 DIAGNOSIS — A549 Gonococcal infection, unspecified: Secondary | ICD-10-CM

## 2019-10-06 MED ORDER — CEFTRIAXONE SODIUM 250 MG IJ SOLR
250.0000 mg | Freq: Once | INTRAMUSCULAR | Status: AC
  Administered 2019-10-06: 250 mg via INTRAMUSCULAR

## 2019-10-06 MED ORDER — AZITHROMYCIN 500 MG PO TABS
1000.0000 mg | ORAL_TABLET | Freq: Once | ORAL | Status: AC
  Administered 2019-10-06: 1000 mg via ORAL

## 2019-10-06 NOTE — Progress Notes (Addendum)
Patient treated for Gonorrhea per standing orders. Patient tolerated well. Contact card for LCSW given. Tawny Hopping, RN

## 2019-10-06 NOTE — Progress Notes (Addendum)
Here today for treatment of Gonorrhea. Patient states she went to [facility] 09/28/2019 for suspected BV and received a call from the ED that she needed treatment for Gonorrhea. Declines all screening today. Tawny Hopping, RN

## 2019-10-06 NOTE — Progress Notes (Signed)
  [facility] Department STI clinic/screening visit  Subjective:  Samantha Carroll is a 24 y.o. female being seen today for  Chief Complaint  Patient presents with  . SEXUALLY TRANSMITTED DISEASE     The patient reports they do have symptoms. Patient reports that they do not desire a pregnancy in the next year. They reported they are not interested in discussing contraception today.   Patient has the following medical conditions:   Patient Active Problem List   Diagnosis Date Noted  . Adjustment disorder with depressed mood 03/17/2014    HPI  Pt reports she was told she had gonorrhea by the ER, is here for treatment only. She has symptoms of vaginal discharge, genital itching/irritation. She declines exam or further STI screening.  See flowsheet for further details and programmatic requirements.    Patient's last menstrual period was 09/19/2019 (approximate). Last sex: January BCM: condoms Desires EC?  n/a  No components found for: HCV  The following portions of the patient's history were reviewed and updated as appropriate: allergies, current medications, past medical history, past social history, past surgical history and problem list.  Objective:  There were no vitals filed for this visit.   Physical Exam Pt declines exam  Assessment and Plan:  Samantha Carroll is a 24 y.o. female presenting to the [facility] Department for STI screening    1. Gonorrhea -Treatment today as below. Pt to RTC if vomits < 2 hr after taking medicine. No known allergies to these medications. -Advised no sex for 7 days after both pt and partner completes treatment and encouraged condoms with all sex. -Pt declines all STI screening today as well as physical exam. - cefTRIAXone (ROCEPHIN) injection 250 mg - azithromycin (ZITHROMAX) tablet 1,000 mg  2. Anxiety and depression Pt endorses anxiety upon flowsheet questioning and would like to speak with behavioral  health, will refer.  - Ambulatory referral to Behavioral Health     Return in about 3 months (around 01/03/2020) for repeat screening.  No future appointments.  Ann Held, PA-C

## 2019-10-11 ENCOUNTER — Other Ambulatory Visit: Payer: Self-pay

## 2019-10-11 ENCOUNTER — Emergency Department
Admission: EM | Admit: 2019-10-11 | Discharge: 2019-10-11 | Disposition: A | Discharge status: Home / Self Care | Payer: Medicaid Other | Attending: Emergency Medicine | Admitting: Emergency Medicine

## 2019-10-11 ENCOUNTER — Encounter: Payer: Self-pay | Admitting: Emergency Medicine

## 2019-10-11 DIAGNOSIS — M436 Torticollis: Secondary | ICD-10-CM | POA: Diagnosis not present

## 2019-10-11 DIAGNOSIS — F17210 Nicotine dependence, cigarettes, uncomplicated: Secondary | ICD-10-CM | POA: Insufficient documentation

## 2019-10-11 MED ORDER — METHOCARBAMOL 500 MG PO TABS: 500.0000 mg | ORAL_TABLET | Freq: Four times a day (QID) | ORAL | 0 refills | Status: DC | PRN

## 2019-10-11 MED ORDER — HYDROCODONE-ACETAMINOPHEN 5-325 MG PO TABS: 1.0000 | ORAL_TABLET | Freq: Four times a day (QID) | ORAL | 0 refills | Status: DC | PRN

## 2019-10-11 MED ORDER — ORPHENADRINE CITRATE 30 MG/ML IJ SOLN
60.0000 mg | Freq: Two times a day (BID) | INTRAMUSCULAR | Status: DC
  Administered 2019-10-11: 12:00:00 60 mg via INTRAMUSCULAR
  Filled 2019-10-11: qty 2

## 2019-10-11 MED ORDER — NAPROXEN 500 MG PO TABS: 500.0000 mg | ORAL_TABLET | Freq: Two times a day (BID) | ORAL | 0 refills | Status: DC

## 2019-10-11 MED ORDER — OXYCODONE-ACETAMINOPHEN 5-325 MG PO TABS
1.0000 | ORAL_TABLET | ORAL | Status: DC | PRN
  Administered 2019-10-11: 1 via ORAL
  Filled 2019-10-11: qty 1

## 2019-10-11 NOTE — ED Triage Notes (Signed)
Pt to triage states neck pain that increases with movement.  States pain started this AM, denies injury.  Pt states pain radiates into head.

## 2019-10-11 NOTE — ED Notes (Signed)
First Nurse Note: Pt to ED via [facility] from home for neck pain. VSS

## 2019-10-11 NOTE — Discharge Instructions (Signed)
Follow-up with your primary care provider if any continued problems or concerns.  Also take medication only as directed and do not take the muscle relaxant and the pain medication and drive or operate machinery.  You may use ice or heat to your neck as needed for discomfort.  Try one and then the other to see which helps you the most.  If there is any worsening of your symptoms or urgent concerns return to the emergency department.

## 2019-10-11 NOTE — ED Provider Notes (Signed)
[facility] Emergency Department Provider Note  ____________________________________________   First MD Initiated Contact with Patient 10/11/19 1107     (approximate)  I have reviewed the triage vital signs and the nursing notes.   HISTORY  Chief Complaint Torticollis   HPI Samantha Carroll is a 24 y.o. female presents to the ED via EMS with complaint of left-sided neck pain.  Patient states that she woke this morning with a "stiff neck".  She denies any paresthesias to her upper extremities.  Patient reports that she slept with the windows open last evening.  Patient has not taken any over-the-counter medication, she denies any trauma, no fever, chills, nausea or vomiting.  She denies any recent URI symptoms or known exposure to Covid.  She rates her pain as a 10/10.      Past Medical History:  Diagnosis Date  . Breast mass     Patient Active Problem List   Diagnosis Date Noted  . Adjustment disorder with depressed mood 03/17/2014    Past Surgical History:  Procedure Laterality Date  . EYE SURGERY    . REFRACTIVE SURGERY     RT eye  . WISDOM TOOTH EXTRACTION      Prior to Admission medications   Medication Sig Start Date End Date Taking? Authorizing Provider  HYDROcodone-acetaminophen (NORCO/VICODIN) 5-325 MG tablet Take 1 tablet by mouth every 6 (six) hours as needed for moderate pain. 10/11/19   Tommi Rumps, PA-C  methocarbamol (ROBAXIN) 500 MG tablet Take 1 tablet (500 mg total) by mouth every 6 (six) hours as needed. 10/11/19   Tommi Rumps, PA-C  naproxen (NAPROSYN) 500 MG tablet Take 1 tablet (500 mg total) by mouth 2 (two) times daily with a meal. 10/11/19   Tommi Rumps, PA-C    Allergies Patient has no known allergies.  Family History  Problem Relation Age of Onset  . Brain cancer Father   . Breast cancer Cousin     Social History Social History   Tobacco Use  . Smoking status: Current Every Day Smoker   Packs/day: 0.50    Types: Cigarettes  . Smokeless tobacco: Never Used  Substance Use Topics  . Alcohol use: Yes    Alcohol/week: 7.0 standard drinks    Types: 7 Shots of liquor per week  . Drug use: No    Review of Systems Constitutional: No fever/chills Cardiovascular: Denies chest pain. Respiratory: Denies shortness of breath. Musculoskeletal: Positive for cervical pain. Skin: Negative for rash. Neurological: Negative for headaches, focal weakness or numbness. ____________________________________________   PHYSICAL EXAM:  VITAL SIGNS: ED Triage Vitals  Enc Vitals Group     BP 10/11/19 1036 132/88     Pulse Rate 10/11/19 1036 81     Resp 10/11/19 1036 18     Temp 10/11/19 1036 98.5 F (36.9 C)     Temp Source 10/11/19 1036 Oral     SpO2 10/11/19 1036 98 %     Weight 10/11/19 1035 200 lb (90.7 kg)     Height 10/11/19 1035 5\' 8"  (1.727 m)     Head Circumference --      Peak Flow --      Pain Score 10/11/19 1040 10     Pain Loc --      Pain Edu? --      Excl. in GC? --     Constitutional: Alert and oriented. Well appearing and in no acute distress.  Patient is tearful in the room  secondary to her pain. Eyes: Conjunctivae are normal. PERRL. EOMI. Head: Atraumatic. Nose: No congestion/rhinnorhea. Mouth/Throat: Mucous membranes are moist.  Oropharynx non-erythematous. Neck: No stridor.  No point tenderness on palpation of cervical spine posteriorly.  There is moderate tenderness on palpation of the left cervical paravertebral muscle into the trapezius.  Patient is able to flex and extend her neck without any difficulty.  It is with lateral movement that increases her pain.  No evidence of injury, ecchymosis or abrasions seen. Cardiovascular: Normal rate, regular rhythm. Grossly normal heart sounds.  Good peripheral circulation. Respiratory: Normal respiratory effort.  No retractions. Lungs CTAB. Gastrointestinal: Soft and nontender. No distention. No abdominal bruits. No  CVA tenderness. Musculoskeletal: Moves upper and lower extremities without any difficulty.  Patient has limited movement due to increase of pain when she does.  Grip strength is equal bilaterally.  Nontender lower extremities. Neurologic:  Normal speech and language. No gross focal neurologic deficits are appreciated. No gait instability. Skin:  Skin is warm, dry and intact. No rash noted. Psychiatric: Mood and affect are normal. Speech and behavior are normal.  ____________________________________________   LABS (all labs ordered are listed, but only abnormal results are displayed)  Labs Reviewed - No data to display ____________________________________________  PROCEDURES  Procedure(s) performed (including Critical Care):  Procedures   ____________________________________________   INITIAL IMPRESSION / ASSESSMENT AND PLAN / ED COURSE  As part of my medical decision making, I reviewed the following data within the electronic MEDICAL RECORD NUMBER Notes from prior ED visits and [place] Controlled Substance Database  Samantha Carroll was evaluated in Emergency Department on 10/11/2019 for the symptoms described in the history of present illness. She was evaluated in the context of the global COVID-19 pandemic, which necessitated consideration that the patient might be at risk for infection with the SARS-CoV-2 virus that causes COVID-19. Institutional protocols and algorithms that pertain to the evaluation of patients at risk for COVID-19 are in a state of rapid change based on information released by regulatory bodies including the CDC and federal and state organizations. These policies and algorithms were followed during the patient's care in the ED.  24 year old female presents to the ED with complaint of cervical pain that started this morning when she woke up.  Patient denied any trauma, fever, nausea or vomiting.  Patient denies any previous history of cervical pain.  Patient was given  Percocet 5/325 while in the ED along with an injection of Norflex 60 mg.  Patient was observed over period of time and prior to discharge patient states that she was feeling much better and was able to have improved range of motion.  She was discharged with a prescription for naproxen 500 mg twice daily, methocarbamol 500 g every 6 hours as needed for muscle spasms and Norco as needed for pain.  Patient is aware that the combination of Norco and methocarbamol could cause drowsiness and she is not to drive while taking this.  She was given a note for work.  She is encouraged to use ice or heat to her neck for comfort.  She will return to the emergency department if any severe worsening of her symptoms.  ____________________________________________   FINAL CLINICAL IMPRESSION(S) / ED DIAGNOSES  Final diagnoses:  Torticollis, acute     ED Discharge Orders         Ordered    naproxen (NAPROSYN) 500 MG tablet  2 times daily with meals     10/11/19 1259    methocarbamol (  ROBAXIN) 500 MG tablet  Every 6 hours PRN     10/11/19 1259    HYDROcodone-acetaminophen (NORCO/VICODIN) 5-325 MG tablet  Every 6 hours PRN     10/11/19 1259           Note:  This document was prepared using Dragon voice recognition software and may include unintentional dictation errors.    Tommi Rumps, PA-C 10/11/19 1414    Concha Se, MD 10/12/19 [phone]

## 2019-10-11 NOTE — ED Notes (Signed)
See triage note  Presents with neck pain  Stats she woke up with stiff neck this am  Denies any trauma or fever  Tearful on arrival to room

## 2020-01-21 ENCOUNTER — Encounter (HOSPITAL_COMMUNITY): Payer: Self-pay | Admitting: Obstetrics & Gynecology

## 2020-01-21 ENCOUNTER — Other Ambulatory Visit: Payer: Self-pay

## 2020-01-21 ENCOUNTER — Inpatient Hospital Stay (HOSPITAL_COMMUNITY)
Admission: EM | Admit: 2020-01-21 | Discharge: 2020-01-21 | Disposition: A | Discharge status: Home / Self Care | Payer: Medicaid Other | Attending: Obstetrics & Gynecology | Admitting: Obstetrics & Gynecology

## 2020-01-21 ENCOUNTER — Inpatient Hospital Stay (HOSPITAL_COMMUNITY): Payer: Medicaid Other

## 2020-01-21 DIAGNOSIS — R109 Unspecified abdominal pain: Secondary | ICD-10-CM | POA: Diagnosis not present

## 2020-01-21 DIAGNOSIS — O99331 Smoking (tobacco) complicating pregnancy, first trimester: Secondary | ICD-10-CM | POA: Diagnosis not present

## 2020-01-21 DIAGNOSIS — R1110 Vomiting, unspecified: Secondary | ICD-10-CM | POA: Insufficient documentation

## 2020-01-21 DIAGNOSIS — R1030 Lower abdominal pain, unspecified: Secondary | ICD-10-CM

## 2020-01-21 DIAGNOSIS — O26891 Other specified pregnancy related conditions, first trimester: Secondary | ICD-10-CM | POA: Diagnosis not present

## 2020-01-21 DIAGNOSIS — Z3A01 Less than 8 weeks gestation of pregnancy: Secondary | ICD-10-CM | POA: Diagnosis not present

## 2020-01-21 DIAGNOSIS — F17210 Nicotine dependence, cigarettes, uncomplicated: Secondary | ICD-10-CM | POA: Insufficient documentation

## 2020-01-21 DIAGNOSIS — O210 Mild hyperemesis gravidarum: Secondary | ICD-10-CM

## 2020-01-21 DIAGNOSIS — O219 Vomiting of pregnancy, unspecified: Secondary | ICD-10-CM

## 2020-01-21 HISTORY — DX: Calculus of kidney: N20.0

## 2020-01-21 HISTORY — DX: Anxiety disorder, unspecified: F41.9

## 2020-01-21 HISTORY — DX: Depression, unspecified: F32.A

## 2020-01-21 LAB — ABO/RH: ABO/RH(D): O POS

## 2020-01-21 LAB — URINALYSIS, ROUTINE W REFLEX MICROSCOPIC
Bacteria, UA: NONE SEEN
Bilirubin Urine: NEGATIVE
Glucose, UA: NEGATIVE mg/dL
Hgb urine dipstick: NEGATIVE
Ketones, ur: 20 mg/dL — AB
Leukocytes,Ua: NEGATIVE
Nitrite: NEGATIVE
Protein, ur: 30 mg/dL — AB
Specific Gravity, Urine: 1.023 (ref 1.005–1.030)
pH: 6 (ref 5.0–8.0)

## 2020-01-21 LAB — WET PREP, GENITAL
Sperm: NONE SEEN
Trich, Wet Prep: NONE SEEN
Yeast Wet Prep HPF POC: NONE SEEN

## 2020-01-21 LAB — HCG, QUANTITATIVE, PREGNANCY: hCG, Beta Chain, Quant, S: 220602 m[IU]/mL — ABNORMAL HIGH (ref <5)

## 2020-01-21 LAB — HIV ANTIBODY (ROUTINE TESTING W REFLEX): HIV Screen 4th Generation wRfx: NONREACTIVE

## 2020-01-21 LAB — COMPREHENSIVE METABOLIC PANEL
ALT: 11 U/L (ref 0–44)
AST: 14 U/L — ABNORMAL LOW (ref 15–41)
Albumin: 3.4 g/dL — ABNORMAL LOW (ref 3.5–5.0)
Alkaline Phosphatase: 51 U/L (ref 38–126)
Anion gap: 8 (ref 5–15)
BUN: 5 mg/dL — ABNORMAL LOW (ref 6–20)
CO2: 21 mmol/L — ABNORMAL LOW (ref 22–32)
Calcium: 8.5 mg/dL — ABNORMAL LOW (ref 8.9–10.3)
Chloride: 106 mmol/L (ref 98–111)
Creatinine, Ser: 0.58 mg/dL (ref 0.44–1.00)
GFR calc Af Amer: >60 mL/min (ref >60)
GFR calc non Af Amer: >60 mL/min (ref >60)
Glucose, Bld: 86 mg/dL (ref 70–99)
Potassium: 3.9 mmol/L (ref 3.5–5.1)
Sodium: 135 mmol/L (ref 135–145)
Total Bilirubin: 0.2 mg/dL — ABNORMAL LOW (ref 0.3–1.2)
Total Protein: 6.5 g/dL (ref 6.5–8.1)

## 2020-01-21 LAB — CBC
HCT: 35.5 % — ABNORMAL LOW (ref 36.0–46.0)
Hemoglobin: 11.2 g/dL — ABNORMAL LOW (ref 12.0–15.0)
MCH: 26.3 pg (ref 26.0–34.0)
MCHC: 31.5 g/dL (ref 30.0–36.0)
MCV: 83.3 fL (ref 80.0–100.0)
Platelets: 304 10*3/uL (ref 150–400)
RBC: 4.26 MIL/uL (ref 3.87–5.11)
RDW: 16.9 % — ABNORMAL HIGH (ref 11.5–15.5)
WBC: 10.3 10*3/uL (ref 4.0–10.5)
nRBC: 0 % (ref 0.0–0.2)

## 2020-01-21 LAB — I-STAT BETA HCG BLOOD, ED (MC, WL, AP ONLY): I-stat hCG, quantitative: >2000 m[IU]/mL — ABNORMAL HIGH (ref <5)

## 2020-01-21 LAB — LIPASE, BLOOD: Lipase: 20 U/L (ref 11–51)

## 2020-01-21 MED ORDER — SODIUM CHLORIDE 0.9% FLUSH: 3.0000 mL | Freq: Once | INTRAVENOUS | Status: DC

## 2020-01-21 MED ORDER — DOXYLAMINE-PYRIDOXINE 10-10 MG PO TBEC
DELAYED_RELEASE_TABLET | ORAL | 2 refills | Status: DC
Start: 2020-01-21 — End: 2020-04-15

## 2020-01-21 NOTE — Progress Notes (Signed)
Nolene Bernheim NP in earlier to discuss test results and d/c plan. Written and verbal d/c instructions given and understanding voiced

## 2020-01-21 NOTE — MAU Note (Signed)
Having like really bad stomach pains, spasms in lower stomach.  Has been throwing up too.  This has been going on for a wk.  Sent up from ER, +preg.

## 2020-01-21 NOTE — ED Provider Notes (Signed)
MSE was initiated and I personally evaluated the patient and placed orders (if any) at  5:17 PM on Jan 21, 2020.  The patient appears stable so that the remainder of the MSE may be completed by another provider.  Samantha Carroll is a 24 y.o. female presents with 1 week of lower abdominal pain with some associated vaginal discharge.  Patient found to have a positive i-STAT hCG, was not aware she was pregnant, has had 2 previous pregnancies without complication.  States that she has had some discharge and persistent lower abdominal discomfort that does not localize to one side.  Denies any vaginal bleeding.  States last menstrual cycle was about 1 month ago.  Patient has normal vitals and is well-appearing with no peritoneal signs on abdominal exam.  Feel she is stable for further evaluation at the MAU.  Discussed with APP Misty Stanley who accepts patient for transfer and further evaluation by OB/GYN.  Patient transported to MAU by nursing staff.   Dartha Lodge, PA-C 01/21/20 1728    Terrilee Files, MD 01/22/20 1011

## 2020-01-21 NOTE — MAU Provider Note (Signed)
History     CSN: [id number]  Arrival date and time: 01/21/20 1501   First Provider Initiated Contact with Patient 01/21/20 1818      Chief Complaint  Patient presents with  . Abdominal Pain  . Emesis   HPI Samantha Carroll  24 y.o. [redacted]w[redacted]d Unsure LMP.  Come from ER at [facility] today.  Having abdominal pain that started last night.  Was 9/10 after getting off from work last night.  Today pain has persisted and is 5/10.  Has not taken any medication for the pain.  Did not realize she was pregnant until today.  Has been having problems keeping down food and fluids but did not associate that with pregnancy as she did not have these symptoms before.   OB History    Gravida  4   Para  2   Term  2   Preterm  0   AB  1   Living  2     SAB  0   TAB  1   Ectopic  0   Multiple      Live Births  2        Obstetric Comments  2nd preg previa- resolved before labor        Past Medical History:  Diagnosis Date  . Anxiety   . Breast mass   . Depression    comes and goes, is good right now  . Kidney stones     Past Surgical History:  Procedure Laterality Date  . EYE SURGERY    . INDUCED ABORTION    . REFRACTIVE SURGERY     RT eye  . WISDOM TOOTH EXTRACTION      Family History  Problem Relation Age of Onset  . Brain cancer Father   . Asthma Father   . Breast cancer Cousin   . Healthy Mother     Social History   Tobacco Use  . Smoking status: Current Every Day Smoker    Packs/day: 1.00    Years: 10.00    Pack years: 10.00    Types: Cigarettes  . Smokeless tobacco: Never Used  Substance Use Topics  . Alcohol use: Yes    Alcohol/week: 7.0 standard drinks    Types: 7 Shots of liquor per week    Comment: wkly  . Drug use: No    Allergies:  Allergies  Allergen Reactions  . Pineapple     Oral itching and swelling    Medications Prior to Admission  Medication Sig Dispense Refill Last Dose  . HYDROcodone-acetaminophen (NORCO/VICODIN) 5-325 MG  tablet Take 1 tablet by mouth every 6 (six) hours as needed for moderate pain. 15 tablet 0   . methocarbamol (ROBAXIN) 500 MG tablet Take 1 tablet (500 mg total) by mouth every 6 (six) hours as needed. 15 tablet 0   . naproxen (NAPROSYN) 500 MG tablet Take 1 tablet (500 mg total) by mouth 2 (two) times daily with a meal. 20 tablet 0     Review of Systems  Constitutional: Negative for fever.  Respiratory: Negative for cough, shortness of breath and wheezing.   Gastrointestinal: Positive for abdominal pain. Negative for nausea and vomiting.  Genitourinary: Negative for dysuria, vaginal bleeding and vaginal discharge.   Physical Exam   Blood pressure 122/78, pulse 66, temperature 98.4 F (36.9 C), resp. rate 16, height 5\' 8"  (1.727 m), weight 83 kg, last menstrual period 12/18/2019, SpO2 99 %.  Physical Exam  Nursing note and vitals reviewed. Constitutional:  She is oriented to person, place, and time. She appears well-developed and well-nourished.  HENT:  Head: Normocephalic.  Eyes: EOM are normal.  GI: Soft. There is no abdominal tenderness. There is no rebound and no guarding.  Genitourinary:    Genitourinary Comments: Speculum exam: Vagina - Small amount of white discharge, no odor Cervix - No contact bleeding Bimanual exam: Cervix closed Uterus enlarged Adnexa mildly tender, L>R GC/Chlam, wet prep done Chaperone present for exam.    Musculoskeletal:        General: Normal range of motion.     Cervical back: Neck supple.  Neurological: She is alert and oriented to person, place, and time.  Skin: Skin is warm and dry.  Psychiatric: She has a normal mood and affect.    MAU Course  Procedures Labs Results for orders placed or performed during the hospital encounter of 01/21/20 (from the past 24 hour(s))  Urinalysis, Routine w reflex microscopic     Status: Abnormal   Collection Time: 01/21/20  3:43 PM  Result Value Ref Range   Color, Urine YELLOW YELLOW   APPearance  CLEAR CLEAR   Specific Gravity, Urine 1.023 1.005 - 1.030   pH 6.0 5.0 - 8.0   Glucose, UA NEGATIVE NEGATIVE mg/dL   Hgb urine dipstick NEGATIVE NEGATIVE   Bilirubin Urine NEGATIVE NEGATIVE   Ketones, ur 20 (A) NEGATIVE mg/dL   Protein, ur 30 (A) NEGATIVE mg/dL   Nitrite NEGATIVE NEGATIVE   Leukocytes,Ua NEGATIVE NEGATIVE   RBC / HPF 0-5 0 - 5 RBC/hpf   WBC, UA 0-5 0 - 5 WBC/hpf   Bacteria, UA NONE SEEN NONE SEEN   Squamous Epithelial / LPF 0-5 0 - 5   Mucus PRESENT   Lipase, blood     Status: None   Collection Time: 01/21/20  3:47 PM  Result Value Ref Range   Lipase 20 11 - 51 U/L  Comprehensive metabolic panel     Status: Abnormal   Collection Time: 01/21/20  3:47 PM  Result Value Ref Range   Sodium 135 135 - 145 mmol/L   Potassium 3.9 3.5 - 5.1 mmol/L   Chloride 106 98 - 111 mmol/L   CO2 21 (L) 22 - 32 mmol/L   Glucose, Bld 86 70 - 99 mg/dL   BUN 5 (L) 6 - 20 mg/dL   Creatinine, Ser 2.20 0.44 - 1.00 mg/dL   Calcium 8.5 (L) 8.9 - 10.3 mg/dL   Total Protein 6.5 6.5 - 8.1 g/dL   Albumin 3.4 (L) 3.5 - 5.0 g/dL   AST 14 (L) 15 - 41 U/L   ALT 11 0 - 44 U/L   Alkaline Phosphatase 51 38 - 126 U/L   Total Bilirubin 0.2 (L) 0.3 - 1.2 mg/dL   GFR calc non Af Amer >60 >60 mL/min   GFR calc Af Amer >60 >60 mL/min   Anion gap 8 5 - 15  CBC     Status: Abnormal   Collection Time: 01/21/20  3:47 PM  Result Value Ref Range   WBC 10.3 4.0 - 10.5 K/uL   RBC 4.26 3.87 - 5.11 MIL/uL   Hemoglobin 11.2 (L) 12.0 - 15.0 g/dL   HCT 25.4 (L) 27.0 - 62.3 %   MCV 83.3 80.0 - 100.0 fL   MCH 26.3 26.0 - 34.0 pg   MCHC 31.5 30.0 - 36.0 g/dL   RDW 76.2 (H) 83.1 - 51.7 %   Platelets 304 150 - 400 K/uL   nRBC 0.0  0.0 - 0.2 %  I-Stat beta hCG blood, ED     Status: Abnormal   Collection Time: 01/21/20  4:01 PM  Result Value Ref Range   I-stat hCG, quantitative >2,000.0 (H) <5 mIU/mL   Comment 3          Wet prep, genital     Status: Abnormal   Collection Time: 01/21/20  6:33 PM   Specimen:  Vaginal  Result Value Ref Range   Yeast Wet Prep HPF POC NONE SEEN NONE SEEN   Trich, Wet Prep NONE SEEN NONE SEEN   Clue Cells Wet Prep HPF POC PRESENT (A) NONE SEEN   WBC, Wet Prep HPF POC MANY (A) NONE SEEN   Sperm NONE SEEN   hCG, quantitative, pregnancy     Status: Abnormal   Collection Time: 01/21/20  6:35 PM  Result Value Ref Range   hCG, Beta Chain, Quant, S 220,602 (H) <5 mIU/mL  ABO/Rh     Status: None   Collection Time: 01/21/20  6:35 PM  Result Value Ref Range   ABO/RH(D) O POS    No rh immune globuloin      NOT A RH IMMUNE GLOBULIN CANDIDATE, PT RH POSITIVE Performed at [facility], [address], [place], [place] [address]     MDM Ectopic workup done as client has irregular menses and does not know when her LMP was.  No reason for abdominal pain found but confirmed living IUP. Reviewed instructions for morning sickness and prescribed Diclegis and reviewed how to start and increase the dose.   Assessment and Plan  Abdominal pain in pregnancy IUP at [redacted]w[redacted]d - ultrasound result was reviewed with client and photo given Morning sickness  Plan Begin prenatal care ASAP - increase PO fluids.  Start Diclegis to help with vomiting. See AVS for additional information given to client.  Fielding Mault L Belmont Valli 01/21/2020, 7:18 PM

## 2020-01-21 NOTE — Discharge Instructions (Signed)
Abdominal Pain During Pregnancy  Belly (abdominal) pain is common during pregnancy. There are many possible causes. Most of the time, it is not a serious problem. Other times, it can be a sign that something is wrong with the pregnancy. Always tell your doctor if you have belly pain. Follow these instructions at home:  Do not have sex or put anything in your vagina until your pain goes away completely.  Get plenty of rest until your pain gets better.  Drink enough fluid to keep your pee (urine) pale yellow.  Take over-the-counter and prescription medicines only as told by your doctor.  Keep all follow-up visits as told by your doctor. This is important. Contact a doctor if:  Your pain continues or gets worse after resting.  You have lower belly pain that: ? Comes and goes at regular times. ? Spreads to your back. ? Feels like menstrual cramps.  You have pain or burning when you pee (urinate). Get help right away if:  You have a fever or chills.  You have vaginal bleeding.  You are leaking fluid from your vagina.  You are passing tissue from your vagina.  You throw up (vomit) for more than 24 hours.  You have watery poop (diarrhea) for more than 24 hours.  Your baby is moving less than usual.  You feel very weak or faint.  You have shortness of breath.  You have very bad pain in your upper belly. Summary  Belly (abdominal) pain is common during pregnancy. There are many possible causes.  If you have belly pain during pregnancy, tell your doctor right away.  Keep all follow-up visits as told by your doctor. This is important. This information is not intended to replace advice given to you by your health care provider. Make sure you discuss any questions you have with your health care provider. Document Revised: 12/01/2018 Document Reviewed: 11/15/2016 Elsevier Patient Education  2020 [employer]. [facility]  Providers          [facility] locations:  Hours may vary. Please call for an appointment  [facility]    [address] [place]    [phone]  Center for [facility] @ [facility]   [address]  [phone]  [facility] @ [facility]       [address] [phone]            Center for [facility] @ [place]     [phone]          [facility] for [facility] @ [facility]   [address] [phone]  Center for [facility] @ [facility]  [address] [phone]     Center for [facility] @ [facility]  [address]   [phone]

## 2020-01-21 NOTE — ED Triage Notes (Signed)
Pt here for one week of lower abdominal pain, similar to when she's had BV in the past. Endorses vomiting x2-3 per day.

## 2020-01-22 LAB — RPR: RPR Ser Ql: NONREACTIVE

## 2020-01-22 LAB — GC/CHLAMYDIA PROBE AMP (~~LOC~~) NOT AT ARMC
Chlamydia: NEGATIVE
Comment: NEGATIVE
Comment: NORMAL
Neisseria Gonorrhea: NEGATIVE

## 2020-02-03 ENCOUNTER — Emergency Department (HOSPITAL_COMMUNITY)
Admission: EM | Admit: 2020-02-03 | Discharge: 2020-02-03 | Disposition: A | Discharge status: Home / Self Care | Payer: Medicaid Other | Attending: Emergency Medicine | Admitting: Emergency Medicine

## 2020-02-03 DIAGNOSIS — O26899 Other specified pregnancy related conditions, unspecified trimester: Secondary | ICD-10-CM | POA: Diagnosis not present

## 2020-02-03 DIAGNOSIS — Z79899 Other long term (current) drug therapy: Secondary | ICD-10-CM | POA: Diagnosis not present

## 2020-02-03 DIAGNOSIS — H9202 Otalgia, left ear: Secondary | ICD-10-CM

## 2020-02-03 DIAGNOSIS — F17210 Nicotine dependence, cigarettes, uncomplicated: Secondary | ICD-10-CM | POA: Insufficient documentation

## 2020-02-03 DIAGNOSIS — Z3A09 9 weeks gestation of pregnancy: Secondary | ICD-10-CM | POA: Diagnosis not present

## 2020-02-03 DIAGNOSIS — H6123 Impacted cerumen, bilateral: Secondary | ICD-10-CM | POA: Diagnosis not present

## 2020-02-03 MED ORDER — CARBAMIDE PEROXIDE 6.5 % OT SOLN: 5.0000 [drp] | Freq: Two times a day (BID) | OTIC | 0 refills | Status: AC

## 2020-02-03 MED ORDER — CARBAMIDE PEROXIDE 6.5 % OT SOLN: 5.0000 [drp] | Freq: Two times a day (BID) | OTIC | 0 refills | Status: DC

## 2020-02-03 MED ORDER — AMOXICILLIN 500 MG PO CAPS: 500.0000 mg | ORAL_CAPSULE | Freq: Three times a day (TID) | ORAL | 0 refills | Status: DC

## 2020-02-03 MED ORDER — AMOXICILLIN 500 MG PO CAPS: 500.0000 mg | ORAL_CAPSULE | Freq: Three times a day (TID) | ORAL | 0 refills | Status: AC

## 2020-02-03 NOTE — Discharge Instructions (Signed)
You have a lot of wax buildup in your ears.  I was not able to remove all of it in the ER.  I prescribed you Debrox eardrops that you should apply immediately as prescribed.  This may be causing your pain.  If you begin running a fever at home (mouth temperature greater than 100.20F), or noticed foul-smelling drainage coming out of your ear, or your ear is turning around, you should start taking your amoxicillin antibiotic as prescribed.  Follow up with your primary care provider in 3-5 days.

## 2020-02-03 NOTE — ED Notes (Signed)
C/o left ear pain, hurts worse with hiccupping and coughing. No drainage

## 2020-02-03 NOTE — ED Provider Notes (Signed)
[facility] EMERGENCY DEPARTMENT Provider Note   CSN: [id number] Arrival date & time: 02/03/20  5732     History CC: left ear pain  Samantha Carroll is a 24 y.o. female who is a [redacted] weeks gestation presented emerge department left ear pain.  She reports has been hurting since yesterday.  She says she used to be able to yawn and it would help with that but the pain has been persistent since yesterday.  She denies any drainage.  She denies any fevers or chills.  She denies any history of recurrent ear infections.  She does report that she has issues with wax buildup in her ears.  HPI     Past Medical History:  Diagnosis Date  . Anxiety   . Breast mass   . Depression    comes and goes, is good right now  . Kidney stones     Patient Active Problem List   Diagnosis Date Noted  . Adjustment disorder with depressed mood 03/17/2014    Past Surgical History:  Procedure Laterality Date  . EYE SURGERY    . INDUCED ABORTION    . REFRACTIVE SURGERY     RT eye  . WISDOM TOOTH EXTRACTION       OB History    Gravida  4   Para  2   Term  2   Preterm  0   AB  1   Living  2     SAB  0   TAB  1   Ectopic  0   Multiple      Live Births  2        Obstetric Comments  2nd preg previa- resolved before labor        Family History  Problem Relation Age of Onset  . Brain cancer Father   . Asthma Father   . Breast cancer Cousin   . Healthy Mother     Social History   Tobacco Use  . Smoking status: Current Every Day Smoker    Packs/day: 1.00    Years: 10.00    Pack years: 10.00    Types: Cigarettes  . Smokeless tobacco: Never Used  Substance Use Topics  . Alcohol use: Yes    Alcohol/week: 7.0 standard drinks    Types: 7 Shots of liquor per week    Comment: wkly  . Drug use: No    Home Medications Prior to Admission medications   Medication Sig Start Date End Date Taking? Authorizing Provider  amoxicillin (AMOXIL) 500 MG capsule  Take 1 capsule (500 mg total) by mouth 3 (three) times daily for 5 days. 02/03/20 02/08/20  Wyvonnia Dusky, MD  carbamide peroxide (DEBROX) 6.5 % OTIC solution Place 5 drops into both ears 2 (two) times daily for 5 days. 02/03/20 02/08/20  Wyvonnia Dusky, MD  Doxylamine-Pyridoxine (DICLEGIS) 10-10 MG TBEC Take 2 tablets at bedtime and one in the morning and one in the afternoon as needed for nausea. 01/21/20   Virginia Rochester, NP    Allergies    Pineapple  Review of Systems   Review of Systems  Constitutional: Negative for chills and fever.  HENT: Positive for ear pain. Negative for ear discharge.   Cardiovascular: Negative for chest pain and palpitations.  Skin: Negative for rash and wound.  Neurological: Negative for light-headedness and headaches.  Psychiatric/Behavioral: Negative for agitation and confusion.  All other systems reviewed and are negative.   Physical Exam Updated Vital  Signs BP 128/69 (BP Location: Right Arm)   Pulse 76   Temp 98 F (36.7 C) (Oral)   Resp 16   LMP 12/18/2019 (Approximate)   SpO2 99%   Physical Exam Vitals and nursing note reviewed.  Constitutional:      General: She is not in acute distress.    Appearance: She is well-developed.  HENT:     Head: Normocephalic and atraumatic.     Comments: Cerumen in bilateral external canals obstructing view of TM No inflammation of external canal or mastoid process Eyes:     Conjunctiva/sclera: Conjunctivae normal.  Cardiovascular:     Rate and Rhythm: Normal rate and regular rhythm.  Pulmonary:     Effort: Pulmonary effort is normal.  Musculoskeletal:     Cervical back: Neck supple.  Skin:    General: Skin is warm and dry.  Neurological:     General: No focal deficit present.     Mental Status: She is alert. Mental status is at baseline.  Psychiatric:        Mood and Affect: Mood normal.        Behavior: Behavior normal.     ED Results / Procedures / Treatments   Labs (all labs ordered  are listed, but only abnormal results are displayed) Labs Reviewed - No data to display  EKG None  Radiology No results found.  Procedures Procedures (including critical care time)  Medications Ordered in ED Medications - No data to display  ED Course  I have reviewed the triage vital signs and the nursing notes.  Pertinent labs & imaging results that were available during my care of the patient were reviewed by me and considered in my medical decision making (see chart for details).  24 yo female well appearing with left ear pain TM is obstructed by significant ear wax bilaterally, which I suspect is the cause of her symptoms I attempted bedside debridement but she could not tolerate this I'll discharge on debrox, offered amoxicillin as a watch and wait script, I do NOT think this is an acute otitis media at the current moment, but we discussed indications to initiate the antibiotics   Final Clinical Impression(s) / ED Diagnoses Final diagnoses:  Excessive cerumen in both ear canals  Left ear pain    Rx / DC Orders ED Discharge Orders         Ordered    carbamide peroxide (DEBROX) 6.5 % OTIC solution  2 times daily,   Status:  Discontinued     02/03/20 1116    amoxicillin (AMOXIL) 500 MG capsule  3 times daily,   Status:  Discontinued     02/03/20 1116    amoxicillin (AMOXIL) 500 MG capsule  3 times daily     02/03/20 1130    carbamide peroxide (DEBROX) 6.5 % OTIC solution  2 times daily     02/03/20 1130           Terald Sleeper, MD 02/03/20 1733

## 2020-02-03 NOTE — ED Triage Notes (Signed)
Pt here with c/o left ear pain no other symptoms , FYI pt is [redacted] weeks pregnant

## 2020-03-24 ENCOUNTER — Ambulatory Visit: Payer: Medicaid Other

## 2020-03-24 DIAGNOSIS — Z3480 Encounter for supervision of other normal pregnancy, unspecified trimester: Secondary | ICD-10-CM

## 2020-03-24 MED ORDER — BLOOD PRESSURE KIT DEVI: 1.0000 | 0 refills | Status: DC | PRN

## 2020-03-24 NOTE — Progress Notes (Signed)
Patient was assessed and managed by nursing staff during this encounter. I have reviewed the chart and agree with the documentation and plan. I have also made any necessary editorial changes.  Debora Stockdale A Timberlee Roblero, MD 03/24/2020 1:15 PM   

## 2020-03-24 NOTE — Progress Notes (Signed)
..  Virtual Visit via Telephone Note  I connected with Shelly Flatten on 03/24/20 at  9:00 AM EDT by telephone and verified that I am speaking with the correct person using two identifiers.  Location: [facility] Patient: Samantha Carroll Provider: Nurse Visit   I discussed the limitations, risks, security and privacy concerns of performing an evaluation and management service by telephone and the availability of in person appointments. I also discussed with the patient that there may be a patient responsible charge related to this service. The patient expressed understanding and agreed to proceed.   History of Present Illness: PRENATAL INTAKE SUMMARY  Ms. Samantha Carroll presents today New OB Nurse Interview.  OB History    Gravida  4   Para  2   Term  2   Preterm  0   AB  1   Living  2     SAB  0   TAB  1   Ectopic  0   Multiple      Live Births  2        Obstetric Comments  2nd preg previa- resolved before labor       I have reviewed the patient's medical, obstetrical, social, and family histories, medications, and available lab results.  SUBJECTIVE She has no unusual complaints and complains of headache.   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 Pt complains of headaches and dizziness at times, [facility], advised pt of precautions and to call or be seen at MAU for abnormal symptoms.  Follow Up Instructions:   I discussed the assessment and treatment plan with the patient. The patient was provided an opportunity to ask questions and all were answered. The patient agreed with the plan and demonstrated an understanding of the instructions.   The patient was  advised to call back or seek an in-person evaluation if the symptoms worsen or if the condition fails to improve as anticipated.  I provided 15 minutes of non-face-to-face time during this encounter.   BHinton Lovely RN

## 2020-03-30 ENCOUNTER — Other Ambulatory Visit (HOSPITAL_COMMUNITY)
Admission: RE | Admit: 2020-03-30 | Discharge: 2020-03-30 | Disposition: A | Discharge status: Home / Self Care | Payer: Medicaid Other | Source: Ambulatory Visit | Attending: Obstetrics | Admitting: Obstetrics

## 2020-03-30 ENCOUNTER — Encounter: Payer: Self-pay | Admitting: Obstetrics

## 2020-03-30 ENCOUNTER — Other Ambulatory Visit: Payer: Self-pay

## 2020-03-30 ENCOUNTER — Ambulatory Visit (INDEPENDENT_AMBULATORY_CARE_PROVIDER_SITE_OTHER): Payer: Medicaid Other | Admitting: Obstetrics

## 2020-03-30 VITALS — BP 127/74 | HR 90 | Wt 180.1 lb

## 2020-03-30 DIAGNOSIS — Z3480 Encounter for supervision of other normal pregnancy, unspecified trimester: Secondary | ICD-10-CM

## 2020-03-30 DIAGNOSIS — F329 Major depressive disorder, single episode, unspecified: Secondary | ICD-10-CM

## 2020-03-30 DIAGNOSIS — Z3A17 17 weeks gestation of pregnancy: Secondary | ICD-10-CM

## 2020-03-30 DIAGNOSIS — O99340 Other mental disorders complicating pregnancy, unspecified trimester: Secondary | ICD-10-CM | POA: Insufficient documentation

## 2020-03-30 DIAGNOSIS — F32A Depression, unspecified: Secondary | ICD-10-CM

## 2020-03-30 DIAGNOSIS — O99342 Other mental disorders complicating pregnancy, second trimester: Secondary | ICD-10-CM | POA: Diagnosis not present

## 2020-03-30 DIAGNOSIS — Z3482 Encounter for supervision of other normal pregnancy, second trimester: Secondary | ICD-10-CM

## 2020-03-30 MED ORDER — PRENATE MINI 29-0.6-0.4-350 MG PO CAPS: 1.0000 | ORAL_CAPSULE | Freq: Every day | ORAL | 3 refills | Status: AC

## 2020-03-30 NOTE — Progress Notes (Signed)
Subjective:    Samantha Carroll is being seen today for her first obstetrical visit.  This is not a planned pregnancy. She is at [redacted]w[redacted]d gestation. Her obstetrical history is significant for depression. Relationship with FOB: significant other, not living together. Patient does not intend to breast feed. Pregnancy history fully reviewed.  The information documented in the HPI was reviewed and verified.  Menstrual History: OB History    Gravida  4   Para  2   Term  2   Preterm  0   AB  1   Living  2     SAB  0   TAB  1   Ectopic  0   Multiple      Live Births  2        Obstetric Comments  2nd preg previa- resolved before labor         Patient's last menstrual period was 12/18/2019 (approximate).    Past Medical History:  Diagnosis Date  . Anxiety   . Breast mass   . Depression    comes and goes, is good right now  . Kidney stones     Past Surgical History:  Procedure Laterality Date  . EYE SURGERY    . INDUCED ABORTION    . REFRACTIVE SURGERY     RT eye  . WISDOM TOOTH EXTRACTION      (Not in a hospital admission)  Allergies  Allergen Reactions  . Pineapple     Oral itching and swelling    Social History   Tobacco Use  . Smoking status: Current Every Day Smoker    Packs/day: 1.00    Years: 10.00    Pack years: 10.00    Types: Cigarettes  . Smokeless tobacco: Never Used  Substance Use Topics  . Alcohol use: Yes    Alcohol/week: 7.0 standard drinks    Types: 7 Shots of liquor per week    Comment: wkly    Family History  Problem Relation Age of Onset  . Brain cancer Father   . Asthma Father   . Breast cancer Cousin   . Healthy Mother      Review of Systems Constitutional: negative for weight loss Gastrointestinal: negative for vomiting Genitourinary:negative for genital lesions and vaginal discharge and dysuria Musculoskeletal:negative for back pain Behavioral/Psych: negative for abusive relationship, depression, illegal drug  usage and tobacco use    Objective:    BP 127/74   Pulse 90   Wt 180 lb 1.6 oz (81.7 kg)   LMP 12/18/2019 (Approximate)   BMI 27.38 kg/m  General Appearance:    Alert, cooperative, no distress, appears stated age  Head:    Normocephalic, without obvious abnormality, atraumatic  Eyes:    PERRL, conjunctiva/corneas clear, EOM's intact, fundi    benign, both eyes  Ears:    Normal TM's and external ear canals, both ears  Nose:   Nares normal, septum midline, mucosa normal, no drainage    or sinus tenderness  Throat:   Lips, mucosa, and tongue normal; teeth and gums normal  Neck:   Supple, symmetrical, trachea midline, no adenopathy;    thyroid:  no enlargement/tenderness/nodules; no carotid   bruit or JVD  Back:     Symmetric, no curvature, ROM normal, no CVA tenderness  Lungs:     Clear to auscultation bilaterally, respirations unlabored  Chest Wall:    No tenderness or deformity   Heart:    Regular rate and rhythm, S1 and S2 normal,  no murmur, rub   or gallop  Breast Exam:    No tenderness, masses, or nipple abnormality  Abdomen:     Soft, non-tender, bowel sounds active all four quadrants,    no masses, no organomegaly  Genitalia:    Normal female without lesion, discharge or tenderness  Extremities:   Extremities normal, atraumatic, no cyanosis or edema  Pulses:   2+ and symmetric all extremities  Skin:   Skin color, texture, turgor normal, no rashes or lesions  Lymph nodes:   Cervical, supraclavicular, and axillary nodes normal  Neurologic:   CNII-XII intact, normal strength, sensation and reflexes    throughout      Lab Review Urine pregnancy test Labs reviewed yes Radiologic studies reviewed no  Assessment:    Pregnancy at [redacted]w[redacted]d weeks    Plan:     1. Supervision of other normal pregnancy, antepartum Rx: - Culture, OB Urine - CBC/D/Plt+RPR+Rh+ABO+Rub Ab... - Cervicovaginal ancillary only - Cytology - PAP - [place] MFM OB COMP + 14 WK; Future - Prenat w/o  A-FeCbn-Meth-FA-DHA (PRENATE MINI) 29-0.6-0.4-350 MG CAPS; Take 1 capsule by mouth daily before breakfast.  Dispense: 90 capsule; Refill: 3  2. Depression affecting pregnancy Rx: - Amb ref to Integrated Behavioral Health   Prenatal vitamins.  Counseling provided regarding continued use of seat belts, cessation of alcohol consumption, smoking or use of illicit drugs; infection precautions i.e., influenza/TDAP immunizations, toxoplasmosis,CMV, parvovirus, listeria and varicella; workplace safety, exercise during pregnancy; routine dental care, safe medications, sexual activity, hot tubs, saunas, pools, travel, caffeine use, fish and methlymercury, potential toxins, hair treatments, varicose veins Weight gain recommendations per IOM guidelines reviewed: underweight/BMI< 18.5--> gain 28 - 40 lbs; normal weight/BMI 18.5 - 24.9--> gain 25 - 35 lbs; overweight/BMI 25 - 29.9--> gain 15 - 25 lbs; obese/BMI >30->gain  11 - 20 lbs Problem list reviewed and updated. FIRST/CF mutation testing/NIPT/QUAD SCREEN/fragile X/Ashkenazi Jewish population testing/Spinal muscular atrophy discussed: requested. Role of ultrasound in pregnancy discussed; fetal survey: requested. Amniocentesis discussed: not indicated.  No orders of the defined types were placed in this encounter.  Orders Placed This Encounter  Procedures  . Culture, OB Urine  . [place] MFM OB COMP + 14 WK    Standing Status:   Future    Standing Expiration Date:   03/30/2021    Order Specific Question:   Reason for Exam (SYMPTOM  OR DIAGNOSIS REQUIRED)    Answer:   Anatomy    Order Specific Question:   Preferred Location    Answer:   [facility] Ultrasound  . CBC/D/Plt+RPR+Rh+ABO+Rub Ab...  . Amb ref to Integrated Behavioral Health    Referral Priority:   Routine    Referral Type:   Consultation    Referral Reason:   Specialty Services Required    Requested Specialty:   Licensed Clinical Social Worker    Number of Visits Requested:   1    Follow up  in 4 weeks. 50% of 20 min visit spent on counseling and coordination of care.    Brock Bad, MD 03/30/2020 8:48 AM

## 2020-03-30 NOTE — Progress Notes (Signed)
Pt. Presents as a New OB.  Pt. Takes she has been having difficulty breathing, blurred vision or seeing black dots. Pt. States she did black out once. Pt. States this happens almost everyday.  Pt. Does have a history of anxiety.  Pt. Also states she is suffering with depression.  PHQ- 22 No pap on file Pt. Request STI testing

## 2020-03-31 ENCOUNTER — Other Ambulatory Visit: Payer: Self-pay | Admitting: Obstetrics

## 2020-03-31 DIAGNOSIS — A549 Gonococcal infection, unspecified: Secondary | ICD-10-CM

## 2020-03-31 DIAGNOSIS — N760 Acute vaginitis: Secondary | ICD-10-CM

## 2020-03-31 LAB — CYTOLOGY - PAP: Diagnosis: NEGATIVE

## 2020-03-31 LAB — HCV INTERPRETATION

## 2020-03-31 LAB — CERVICOVAGINAL ANCILLARY ONLY
Bacterial Vaginitis (gardnerella): POSITIVE — AB
Candida Glabrata: NEGATIVE
Candida Vaginitis: NEGATIVE
Chlamydia: NEGATIVE
Comment: NEGATIVE
Comment: NEGATIVE
Comment: NEGATIVE
Comment: NEGATIVE
Comment: NEGATIVE
Comment: NORMAL
Neisseria Gonorrhea: POSITIVE — AB
Trichomonas: NEGATIVE

## 2020-03-31 LAB — CBC/D/PLT+RPR+RH+ABO+RUB AB...
Antibody Screen: NEGATIVE
Basophils Absolute: 0 10*3/uL (ref 0.0–0.2)
Basos: 0 %
EOS (ABSOLUTE): 0.2 10*3/uL (ref 0.0–0.4)
Eos: 2 %
HCV Ab: 0.1 s/co ratio (ref 0.0–0.9)
HIV Screen 4th Generation wRfx: NONREACTIVE
Hematocrit: 36.9 % (ref 34.0–46.6)
Hemoglobin: 12 g/dL (ref 11.1–15.9)
Hepatitis B Surface Ag: NEGATIVE
Immature Grans (Abs): 0 10*3/uL (ref 0.0–0.1)
Immature Granulocytes: 0 %
Lymphocytes Absolute: 1.8 10*3/uL (ref 0.7–3.1)
Lymphs: 17 %
MCH: 28 pg (ref 26.6–33.0)
MCHC: 32.5 g/dL (ref 31.5–35.7)
MCV: 86 fL (ref 79–97)
Monocytes Absolute: 0.5 10*3/uL (ref 0.1–0.9)
Monocytes: 5 %
Neutrophils Absolute: 7.9 10*3/uL — ABNORMAL HIGH (ref 1.4–7.0)
Neutrophils: 76 %
Platelets: 331 10*3/uL (ref 150–450)
RBC: 4.28 x10E6/uL (ref 3.77–5.28)
RDW: 16.3 % — ABNORMAL HIGH (ref 11.7–15.4)
RPR Ser Ql: NONREACTIVE
Rh Factor: POSITIVE
Rubella Antibodies, IGG: 2.69 index (ref >0.99)
WBC: 10.4 10*3/uL (ref 3.4–10.8)

## 2020-03-31 MED ORDER — TINIDAZOLE 500 MG PO TABS: 1000.0000 mg | ORAL_TABLET | Freq: Every day | ORAL | 2 refills | Status: DC

## 2020-03-31 MED ORDER — CEFTRIAXONE SODIUM 500 MG IJ SOLR: 500.0000 mg | Freq: Once | INTRAMUSCULAR | Status: DC

## 2020-04-01 LAB — CULTURE, OB URINE

## 2020-04-01 LAB — URINE CULTURE, OB REFLEX

## 2020-04-04 ENCOUNTER — Other Ambulatory Visit: Payer: Self-pay

## 2020-04-04 ENCOUNTER — Ambulatory Visit (INDEPENDENT_AMBULATORY_CARE_PROVIDER_SITE_OTHER): Payer: Medicaid Other

## 2020-04-04 DIAGNOSIS — A549 Gonococcal infection, unspecified: Secondary | ICD-10-CM

## 2020-04-04 MED ORDER — CEFTRIAXONE SODIUM 500 MG IJ SOLR
500.0000 mg | Freq: Once | INTRAMUSCULAR | Status: AC
  Administered 2020-04-04: 500 mg via INTRAMUSCULAR

## 2020-04-04 NOTE — Progress Notes (Signed)
Patient presents for Rocephin injection.  Made aware will need TOC   Injection Tolerated without any problems.

## 2020-04-05 ENCOUNTER — Ambulatory Visit (INDEPENDENT_AMBULATORY_CARE_PROVIDER_SITE_OTHER): Payer: Medicaid Other | Admitting: Licensed Clinical Social Worker

## 2020-04-05 DIAGNOSIS — F329 Major depressive disorder, single episode, unspecified: Secondary | ICD-10-CM

## 2020-04-05 DIAGNOSIS — O99344 Other mental disorders complicating childbirth: Secondary | ICD-10-CM

## 2020-04-06 NOTE — BH Specialist Note (Signed)
Integrated Behavioral Health via Telemedicine Video (Caregility) Visit  04/06/2020 [facility] [id number]  Number of Integrated Behavioral Health visits: 1 Session Start time: 3:15pm  Session End time: 4:02pm Total time: 47 mins via mychart   Referring Provider:  Type of Visit: Video Patient/Family location: Home  [facility] Provider location: [facility]  All persons participating in visit: Pt Samantha Carroll and LCSWA A. Felton Clinton   Discussed confidentiality: yes  I connected with [name] and/or [name] n/a by a video enabled telemedicine application (Caregility) and verified that I am speaking with the correct person using two identifiers.    I discussed that engaging in this virtual visit, they consent to the provision of behavioral healthcare and the services will be billed under their insurance.   Patient and/or legal guardian expressed understanding and consented to virtual visit: yes   PRESENTING CONCERNS: Patient and/or family reports the following symptoms/concerns: isolation, feeling of guilt, depressed mood, passive suicidal ideation, abandonment, feeling of worthlessness. Duration of problem: Since adolescence ; Severity of problem: Moderate   STRENGTHS (Protective Factors/Coping Skills): Employed full time, stable housing, and seeking services for depression.   GOALS ADDRESSED: Patient will: 1.  Reduce symptoms of: depression   2.  Increase knowledge of diagnosis and implement coping skills to alleviate symptoms   3.  Demonstrate ability to: self manage symptoms   INTERVENTIONS: Interventions utilized:  Supportive and cbt  Standardized Assessments completed:     Initial Prenatal from 03/30/2020 [facility]  PHQ-9 Total Score 22      ASSESSMENT: Patient currently experiencing major depression disorder. Ms. Briones reports history of suicidal ideation. Ms. Beecher denies risk of harm to self or others.  According to Ms. Leyba, she does not have a support system and is withdrawn and socially isolated. Ms. Davidoff reports her children live with their father because she was not in a position to provide safe and stable housing at that time. Ms. Russaw reports she will consider medication assistance for depression. Ms. Girten reports she is collaborating with an [employer] and in communication with the potential adoptive mother. Ms. Roswell reports getting 2-3 hours of sleep and does not have an appetite to eat most times.   Patient may benefit from integrated and collaborative care   PLAN: 1. Follow up with behavioral health clinician on : 2 weeks via mychart  2. Behavioral recommendations: Take prescribed medication as directed, attend all schedule medical and behavioral health appts, prioritize sleep, start journal writing and implement mindfulness technique 5-10 a day to alleviate symptoms.  3. Referral(s): [facility]   I discussed the assessment and treatment plan with the patient and/or parent/guardian. They were provided an opportunity to ask questions and all were answered. They agreed with the plan and demonstrated an understanding of the instructions.   They were advised to call back or seek an in-person evaluation if the symptoms worsen or if the condition fails to improve as anticipated.   Confirmed patient's address: yes Confirmed patient's phone number: yes Any changes to demographics: yes  Confirmed patient's insurance: no Any changes to patient's insurance: n/a  I discussed the limitations of evaluation and management by telemedicine and the availability of in person appointments.  I discussed that the purpose of this visit is to provide behavioral health care while limiting exposure to the novel coronavirus.   Discussed there is a possibility of technology failure and discussed alternative modes of communication if that failure occurs.  Sue Lush  Felton Clinton

## 2020-04-07 NOTE — Progress Notes (Signed)
Patient ID: Samantha Carroll, female   DOB: 1996/03/02, 24 y.o.   MRN: [id number] Patient was assessed and managed by nursing staff during this encounter. I have reviewed the chart and agree with the documentation and plan. I have also made any necessary editorial changes.  Scheryl Darter, MD 04/07/2020 3:28 PM

## 2020-04-14 ENCOUNTER — Ambulatory Visit: Payer: Medicaid Other

## 2020-04-14 ENCOUNTER — Other Ambulatory Visit: Payer: Self-pay

## 2020-04-14 ENCOUNTER — Ambulatory Visit: Payer: Medicaid Other | Attending: Obstetrics

## 2020-04-14 DIAGNOSIS — Z363 Encounter for antenatal screening for malformations: Secondary | ICD-10-CM | POA: Diagnosis not present

## 2020-04-14 DIAGNOSIS — Z3A19 19 weeks gestation of pregnancy: Secondary | ICD-10-CM | POA: Diagnosis not present

## 2020-04-14 DIAGNOSIS — Z368A Encounter for antenatal screening for other genetic defects: Secondary | ICD-10-CM

## 2020-04-14 DIAGNOSIS — Z3480 Encounter for supervision of other normal pregnancy, unspecified trimester: Secondary | ICD-10-CM | POA: Diagnosis present

## 2020-04-15 ENCOUNTER — Other Ambulatory Visit: Payer: Self-pay

## 2020-04-15 ENCOUNTER — Inpatient Hospital Stay (HOSPITAL_COMMUNITY): Payer: Medicaid Other

## 2020-04-15 ENCOUNTER — Inpatient Hospital Stay (HOSPITAL_COMMUNITY)
Admission: AD | Admit: 2020-04-15 | Discharge: 2020-04-15 | Disposition: A | Discharge status: Home / Self Care | Payer: Medicaid Other | Attending: Obstetrics and Gynecology | Admitting: Obstetrics and Gynecology

## 2020-04-15 ENCOUNTER — Encounter (HOSPITAL_COMMUNITY): Payer: Self-pay | Admitting: Obstetrics and Gynecology

## 2020-04-15 DIAGNOSIS — O26832 Pregnancy related renal disease, second trimester: Secondary | ICD-10-CM | POA: Diagnosis not present

## 2020-04-15 DIAGNOSIS — R310 Gross hematuria: Secondary | ICD-10-CM | POA: Insufficient documentation

## 2020-04-15 DIAGNOSIS — N1330 Unspecified hydronephrosis: Secondary | ICD-10-CM | POA: Diagnosis not present

## 2020-04-15 DIAGNOSIS — R109 Unspecified abdominal pain: Secondary | ICD-10-CM

## 2020-04-15 DIAGNOSIS — O99891 Other specified diseases and conditions complicating pregnancy: Secondary | ICD-10-CM | POA: Diagnosis not present

## 2020-04-15 DIAGNOSIS — N202 Calculus of kidney with calculus of ureter: Secondary | ICD-10-CM

## 2020-04-15 DIAGNOSIS — O26892 Other specified pregnancy related conditions, second trimester: Secondary | ICD-10-CM | POA: Insufficient documentation

## 2020-04-15 DIAGNOSIS — R110 Nausea: Secondary | ICD-10-CM | POA: Diagnosis not present

## 2020-04-15 DIAGNOSIS — N3289 Other specified disorders of bladder: Secondary | ICD-10-CM | POA: Insufficient documentation

## 2020-04-15 DIAGNOSIS — Z87891 Personal history of nicotine dependence: Secondary | ICD-10-CM | POA: Diagnosis not present

## 2020-04-15 DIAGNOSIS — Z3A20 20 weeks gestation of pregnancy: Secondary | ICD-10-CM

## 2020-04-15 DIAGNOSIS — Z79899 Other long term (current) drug therapy: Secondary | ICD-10-CM | POA: Diagnosis not present

## 2020-04-15 DIAGNOSIS — Z87442 Personal history of urinary calculi: Secondary | ICD-10-CM | POA: Insufficient documentation

## 2020-04-15 DIAGNOSIS — N200 Calculus of kidney: Secondary | ICD-10-CM

## 2020-04-15 LAB — COMPREHENSIVE METABOLIC PANEL
ALT: 9 U/L (ref 0–44)
AST: 11 U/L — ABNORMAL LOW (ref 15–41)
Albumin: 2.9 g/dL — ABNORMAL LOW (ref 3.5–5.0)
Alkaline Phosphatase: 56 U/L (ref 38–126)
Anion gap: 11 (ref 5–15)
BUN: 6 mg/dL (ref 6–20)
CO2: 21 mmol/L — ABNORMAL LOW (ref 22–32)
Calcium: 9.1 mg/dL (ref 8.9–10.3)
Chloride: 103 mmol/L (ref 98–111)
Creatinine, Ser: 0.65 mg/dL (ref 0.44–1.00)
GFR calc Af Amer: >60 mL/min (ref >60)
GFR calc non Af Amer: >60 mL/min (ref >60)
Glucose, Bld: 79 mg/dL (ref 70–99)
Potassium: 4 mmol/L (ref 3.5–5.1)
Sodium: 135 mmol/L (ref 135–145)
Total Bilirubin: 0.3 mg/dL (ref 0.3–1.2)
Total Protein: 6.3 g/dL — ABNORMAL LOW (ref 6.5–8.1)

## 2020-04-15 LAB — URINALYSIS, ROUTINE W REFLEX MICROSCOPIC
Bilirubin Urine: NEGATIVE
Glucose, UA: NEGATIVE mg/dL
Ketones, ur: NEGATIVE mg/dL
Leukocytes,Ua: NEGATIVE
Nitrite: NEGATIVE
Protein, ur: NEGATIVE mg/dL
RBC / HPF: >50 RBC/hpf — ABNORMAL HIGH (ref 0–5)
Specific Gravity, Urine: 1.02 (ref 1.005–1.030)
pH: 6 (ref 5.0–8.0)

## 2020-04-15 LAB — CBC
HCT: 33.5 % — ABNORMAL LOW (ref 36.0–46.0)
Hemoglobin: 11 g/dL — ABNORMAL LOW (ref 12.0–15.0)
MCH: 28.7 pg (ref 26.0–34.0)
MCHC: 32.8 g/dL (ref 30.0–36.0)
MCV: 87.5 fL (ref 80.0–100.0)
Platelets: 290 10*3/uL (ref 150–400)
RBC: 3.83 MIL/uL — ABNORMAL LOW (ref 3.87–5.11)
RDW: 15.1 % (ref 11.5–15.5)
WBC: 12.8 10*3/uL — ABNORMAL HIGH (ref 4.0–10.5)
nRBC: 0 % (ref 0.0–0.2)

## 2020-04-15 MED ORDER — ACETAMINOPHEN 500 MG PO TABS
1000.0000 mg | ORAL_TABLET | Freq: Once | ORAL | Status: AC
  Administered 2020-04-15: 1000 mg via ORAL
  Filled 2020-04-15: qty 2

## 2020-04-15 MED ORDER — TAMSULOSIN HCL 0.4 MG PO CAPS: 0.4000 mg | ORAL_CAPSULE | Freq: Every day | ORAL | 0 refills | Status: DC

## 2020-04-15 MED ORDER — TAMSULOSIN HCL 0.4 MG PO CAPS
0.4000 mg | ORAL_CAPSULE | Freq: Once | ORAL | Status: AC
  Administered 2020-04-15: 0.4 mg via ORAL
  Filled 2020-04-15: qty 1

## 2020-04-15 MED ORDER — OXYCODONE HCL 5 MG PO TABS: 5.0000 mg | ORAL_TABLET | Freq: Three times a day (TID) | ORAL | 0 refills | Status: AC | PRN

## 2020-04-15 NOTE — Discharge Instructions (Signed)
Kidney Stones  Kidney stones are solid, rock-like deposits that form inside of the kidneys. The kidneys are a pair of organs that make urine. A kidney stone may form in a kidney and move into other parts of the urinary tract, including the tubes that connect the kidneys to the bladder (ureters), the bladder, and the tube that carries urine out of the body (urethra). As the stone moves through these areas, it can cause intense pain and block the flow of urine. Kidney stones are created when high levels of certain minerals are found in the urine. The stones are usually passed out of the body through urination, but in some cases, medical treatment may be needed to remove them. What are the causes? Kidney stones may be caused by:  A condition in which certain glands produce too much parathyroid hormone (primary hyperparathyroidism), which causes too much calcium buildup in the blood.  A buildup of uric acid crystals in the bladder (hyperuricosuria). Uric acid is a chemical that the body produces when you eat certain foods. It usually exits the body in the urine.  Narrowing (stricture) of one or both of the ureters.  A kidney blockage that is present at birth (congenital obstruction).  Past surgery on the kidney or the ureters, such as gastric bypass surgery. What increases the risk? The following factors may make you more likely to develop this condition:  Having had a kidney stone in the past.  Having a family history of kidney stones.  Not drinking enough water.  Eating a diet that is high in protein, salt (sodium), or sugar.  Being overweight or obese. What are the signs or symptoms? Symptoms of a kidney stone may include:  Pain in the side of the abdomen, right below the ribs (flank pain). Pain usually spreads (radiates) to the groin.  Needing to urinate frequently or urgently.  Painful urination.  Blood in the urine (hematuria).  Nausea.  Vomiting.  Fever and chills. How  is this diagnosed? This condition may be diagnosed based on:  Your symptoms and medical history.  A physical exam.  Blood tests.  Urine tests. These may be done before and after the stone passes out of your body through urination.  Imaging tests, such as a CT scan, abdominal X-ray, or ultrasound.  A procedure to examine the inside of the bladder (cystoscopy). How is this treated? Treatment for kidney stones depends on the size, location, and makeup of the stones. Kidney stones will often pass out of the body through urination. You may need to:  Increase your fluid intake to help pass the stone. In some cases, you may be given fluids through an IV and may need to be monitored at the hospital.  Take medicine for pain.  Make changes in your diet to help prevent kidney stones from coming back. Sometimes, medical procedures are needed to remove a kidney stone. This may involve:  A procedure to break up kidney stones using: ? A focused beam of light (laser therapy). ? Shock waves (extracorporeal shock wave lithotripsy).  Surgery to remove kidney stones. This may be needed if you have severe pain or have stones that block your urinary tract. Follow these instructions at home: Medicines  Take over-the-counter and prescription medicines only as told by your health care provider.  Ask your health care provider if the medicine prescribed to you requires you to avoid driving or using heavy machinery. Eating and drinking  Drink enough fluid to keep your urine pale yellow.   You may be instructed to drink at least 8-10 glasses of water each day. This will help you pass the kidney stone.  If directed, change your diet. This may include: ? Limiting how much sodium you eat. ? Eating more fruits and vegetables. ? Limiting how much animal protein--such as red meat, poultry, fish, and eggs--you eat.  Follow instructions from your health care provider about eating or drinking  restrictions. General instructions  Collect urine samples as told by your health care provider. You may need to collect a urine sample: ? 24 hours after you pass the stone. ? 8-12 weeks after passing the kidney stone, and every 6-12 months after that.  Strain your urine every time you urinate, for as long as directed. Use the strainer that your health care provider recommends.  Do not throw out the kidney stone after passing it. Keep the stone so it can be tested by your health care provider. Testing the makeup of your kidney stone may help prevent you from getting kidney stones in the future.  Keep all follow-up visits as told by your health care provider. This is important. You may need follow-up X-rays or ultrasounds to make sure that your stone has passed. How is this prevented? To prevent another kidney stone:  Drink enough fluid to keep your urine pale yellow. This is the best way to prevent kidney stones.  Eat a healthy diet and follow recommendations from your health care provider about foods to avoid. You may be instructed to eat a low-protein diet. Recommendations vary depending on the type of kidney stone that you have.  Maintain a healthy weight. Where to find more information  National Kidney Foundation (NKF): www.kidney.org  [employer] Foundation (UCF): www.urologyhealth.org Contact a health care provider if:  You have pain that gets worse or does not get better with medicine. Get help right away if:  You have a fever or chills.  You develop severe pain.  You develop new abdominal pain.  You faint.  You are unable to urinate. Summary  Kidney stones are solid, rock-like deposits that form inside of the kidneys.  Kidney stones can cause nausea, vomiting, blood in the urine, abdominal pain, and the urge to urinate frequently.  Treatment for kidney stones depends on the size, location, and makeup of the stones. Kidney stones will often pass out of the body  through urination.  Kidney stones can be prevented by drinking enough fluids, eating a healthy diet, and maintaining a healthy weight. This information is not intended to replace advice given to you by your health care provider. Make sure you discuss any questions you have with your health care provider. Document Revised: 12/30/2018 Document Reviewed: 12/30/2018 Elsevier Patient Education  2020 [employer] Inc.  

## 2020-04-15 NOTE — ED Triage Notes (Signed)
Emergency Medicine Provider OB Triage Evaluation Note  Samantha Carroll is a 24 y.o. female, [id number], at 109w0d gestation who presents to the emergency department with complaints of right flank pain today with difficulty voiding and hematuria.  Patient has ho kidney stones. No fever or vomiting.   Review of  Systems  Positive: right flank pain, hematuria  Negative: no cough, no covid exposure, no vaccine  Physical Exam  BP 107/68 (BP Location: Left Arm)   Pulse 99   Temp 99.2 F (37.3 C) (Oral)   Resp 14   Ht 1.727 m (5\' 8" )   Wt 82.6 kg   LMP 12/18/2019 (Approximate)   SpO2 99%   BMI 27.67 kg/m  General: Awake, no distress  HEENT: Atraumatic  Resp: Normal effort  Cardiac: Normal rate Abd: Nondistended, nontender  MSK: Moves all extremities without difficulty Neuro: Speech clear  Medical Decision Making  Pt evaluated for pregnancy concern and is stable for transfer to [facility]. Pt is in agreement with plan for transfer.  12:36 PM Discussed with [facility] APP, 12/20/2019, who accepts patient in transfer.  Clinical Impression   1. Right flank pain   2. [redacted] weeks gestation of pregnancy        Denny Peon, MD 04/15/20 1239

## 2020-04-15 NOTE — MAU Provider Note (Signed)
Chief Complaint: Flank Pain   First Provider Initiated Contact with Patient 04/15/20 1351     SUBJECTIVE HPI: Samantha Carroll is a 24 y.o. [id number] at 13w0dwho presents to [facility] reporting right flank pain. Symptoms started 3 days ago. She has a history of kidney stones & reports current symptoms feel like that but not as severe. Has had occasional nausea and noticed some blood in her urine. Denies fever/chills, vomiting, dysuria, vaginal bleeding, or abdominal pain.   Location: right flank Quality: aching, throbbing Severity: 6/10 on pain scale Duration: 3 days Timing: constant Modifying factors: none Associated signs and symptoms: nausea, hematuria  Past Medical History:  Diagnosis Date  . Anxiety   . Breast mass   . Depression    comes and goes, is good right now  . Kidney stones    OB History  Gravida Para Term Preterm AB Living  _0 0 1 2  SAB TAB Ectopic Multiple Live Births  0 1 0   2    # Outcome Date GA Lbr Len/2nd Weight Sex Delivery Anes PTL Lv  4 Current           3 TAB 2020          2 Term 04/25/18    F Vag-Spont  N LIV  1 Term 06/18/17    M Vag-Spont  N LIV    Obstetric Comments  2nd preg previa- resolved before labor   Past Surgical History:  Procedure Laterality Date  . EYE SURGERY    . INDUCED ABORTION    . REFRACTIVE SURGERY     RT eye  . WISDOM TOOTH EXTRACTION     Social History   Socioeconomic History  . Marital status: Single    Spouse name: Not on file  . Number of children: Not on file  . Years of education: Not on file  . Highest education level: Not on file  Occupational History  . Not on file  Tobacco Use  . Smoking status: Former Smoker    Packs/day: 1.00    Years: 10.00    Pack years: 10.00    Types: Cigarettes    Quit date: 01/14/2020    Years since quitting: 0.2  . Smokeless tobacco: Never Used  Vaping Use  . Vaping Use: Former  Substance and Sexual Activity  . Alcohol use: Not Currently     Alcohol/week: 7.0 standard drinks    Types: 7 Shots of liquor per week    Comment: wkly  . Drug use: No  . Sexual activity: Yes    Partners: Female, Female    Birth control/protection: Condom  Other Topics Concern  . Not on file  Social History Narrative   ** Merged History Encounter **       Social Determinants of Health   Financial Resource Strain:   . Difficulty of Paying Living Expenses: Not on file  Food Insecurity:   . Worried About RCharity fundraiserin the Last Year: Not on file  . Ran Out of Food in the Last Year: Not on file  Transportation Needs:   . Lack of Transportation (Medical): Not on file  . Lack of Transportation (Non-Medical): Not on file  Physical Activity:   . Days of Exercise per Week: Not on file  . Minutes of Exercise per Session: Not on file  Stress:   . Feeling of Stress : Not on file  Social Connections:   . Frequency of Communication with  Friends and Family: Not on file  . Frequency of Social Gatherings with Friends and Family: Not on file  . Attends Religious Services: Not on file  . Active Member of Clubs or Organizations: Not on file  . Attends Archivist Meetings: Not on file  . Marital Status: Not on file  Intimate Partner Violence:   . Fear of Current or Ex-Partner: Not on file  . Emotionally Abused: Not on file  . Physically Abused: Not on file  . Sexually Abused: Not on file   Family History  Problem Relation Age of Onset  . Brain cancer Father   . Asthma Father   . Breast cancer Cousin   . Healthy Mother    No current facility-administered medications on file prior to encounter.   Current Outpatient Medications on File Prior to Encounter  Medication Sig Dispense Refill  . Multiple Vitamins-Minerals (MULTIVITAMIN WITH MINERALS) tablet Take 1 tablet by mouth daily.    . Prenat w/o A-FeCbn-Meth-FA-DHA (PRENATE MINI) 29-0.6-0.4-350 MG CAPS Take 1 capsule by mouth daily before breakfast. 90 capsule 3  . Blood Pressure  Monitoring (BLOOD PRESSURE KIT) DEVI 1 kit by Does not apply route as needed. 1 each 0   Allergies  Allergen Reactions  . Pineapple     Oral itching and swelling    I have reviewed patient's Past Medical Hx, Surgical Hx, Family Hx, Social Hx, medications and allergies.   Review of Systems  Constitutional: Negative.   Gastrointestinal: Positive for nausea. Negative for abdominal pain, constipation, diarrhea and vomiting.  Genitourinary: Positive for flank pain, hematuria and urgency. Negative for dysuria, frequency, genital sores, vaginal bleeding and vaginal discharge.    OBJECTIVE Patient Vitals for the past 24 hrs:  BP Temp Temp src Pulse Resp SpO2 Height Weight  04/15/20 1610 115/61 -- -- 95 18 -- -- --  04/15/20 1338 113/63 98.6 F (37 C) Oral 90 16 98 % -- --  04/15/20 1227 107/68 99.2 F (37.3 C) Oral 99 14 99 % _0  (1.727 m) 82.6 kg   Constitutional: Well-developed, well-nourished female in no acute distress.  Cardiovascular: normal rate & rhythm, no murmur Respiratory: normal rate and effort. Lung sounds clear throughout GI: No CVA tenderness. Abd soft, non-tender, Pos BS x 4. No guarding or rebound tenderness MS: Extremities nontender, no edema, normal ROM Neurologic: Alert and oriented x 4.     LAB RESULTS Results for orders placed or performed during the hospital encounter of 04/15/20 (from the past 24 hour(s))  Urinalysis, Routine w reflex microscopic Urine, Clean Catch     Status: Abnormal   Collection Time: 04/15/20  1:20 PM  Result Value Ref Range   Color, Urine YELLOW YELLOW   APPearance CLEAR CLEAR   Specific Gravity, Urine 1.020 1.005 - 1.030   pH 6.0 5.0 - 8.0   Glucose, UA NEGATIVE NEGATIVE mg/dL   Hgb urine dipstick MODERATE (A) NEGATIVE   Bilirubin Urine NEGATIVE NEGATIVE   Ketones, ur NEGATIVE NEGATIVE mg/dL   Protein, ur NEGATIVE NEGATIVE mg/dL   Nitrite NEGATIVE NEGATIVE   Leukocytes,Ua NEGATIVE NEGATIVE   RBC / HPF >50 (H) 0 - 5 RBC/hpf    WBC, UA 6-10 0 - 5 WBC/hpf   Bacteria, UA RARE (A) NONE SEEN   Squamous Epithelial / LPF 0-5 0 - 5   Mucus PRESENT   CBC     Status: Abnormal   Collection Time: 04/15/20  2:27 PM  Result Value Ref Range   WBC 12.8 (  H) 4.0 - 10.5 K/uL   RBC 3.83 (L) 3.87 - 5.11 MIL/uL   Hemoglobin 11.0 (L) 12.0 - 15.0 g/dL   HCT 33.5 (L) 36 - 46 %   MCV 87.5 80.0 - 100.0 fL   MCH 28.7 26.0 - 34.0 pg   MCHC 32.8 30.0 - 36.0 g/dL   RDW 15.1 11.5 - 15.5 %   Platelets 290 150 - 400 K/uL   nRBC 0.0 0.0 - 0.2 %  Comprehensive metabolic panel     Status: Abnormal   Collection Time: 04/15/20  2:27 PM  Result Value Ref Range   Sodium 135 135 - 145 mmol/L   Potassium 4.0 3.5 - 5.1 mmol/L   Chloride 103 98 - 111 mmol/L   CO2 21 (L) 22 - 32 mmol/L   Glucose, Bld 79 70 - 99 mg/dL   BUN 6 6 - 20 mg/dL   Creatinine, Ser 0.65 0.44 - 1.00 mg/dL   Calcium 9.1 8.9 - 10.3 mg/dL   Total Protein 6.3 (L) 6.5 - 8.1 g/dL   Albumin 2.9 (L) 3.5 - 5.0 g/dL   AST 11 (L) 15 - 41 U/L   ALT 9 0 - 44 U/L   Alkaline Phosphatase 56 38 - 126 U/L   Total Bilirubin 0.3 0.3 - 1.2 mg/dL   GFR calc non Af Amer >60 >60 mL/min   GFR calc Af Amer >60 >60 mL/min   Anion gap 11 5 - 15    IMAGING US RENAL  Result Date: 04/15/2020 CLINICAL DATA:  Right flank pain with hematuria, [redacted] weeks pregnant EXAM: RENAL / URINARY TRACT ULTRASOUND COMPLETE COMPARISON:  None. FINDINGS: Right Kidney: Renal measurements: 12 x 4.7 x 6 cm = volume: 176.1 mL. Cortical echogenicity is normal. No mass. Mild to moderate right hydronephrosis and proximal hydroureter. Left Kidney: Renal measurements: 11.1 x 4.7 x 4.6 cm = volume: 126.6 mL. Echogenicity within normal limits. No mass or hydronephrosis visualized. Bladder: Appears normal for degree of bladder distention. Other: None. IMPRESSION: 1. Mild to moderate right hydronephrosis and proximal hydroureter. 2. Normal ultrasound appearance of the left kidney Electronically Signed   By: Donavan Foil M.D.   On:  04/15/2020 15:25    MAU COURSE Orders Placed This Encounter  Procedures  . Culture, OB Urine  . US RENAL  . Urinalysis, Routine w reflex microscopic Urine, Clean Catch  . CBC  . Comprehensive metabolic panel  . Diet regular Room service appropriate? Yes; Fluid consistency: Thin  . Discharge patient   Meds ordered this encounter  Medications  . acetaminophen (TYLENOL) tablet 1,000 mg  . tamsulosin (FLOMAX) capsule 0.4 mg  . tamsulosin (FLOMAX) 0.4 MG CAPS capsule    Sig: Take 1 capsule (0.4 mg total) by mouth daily.    Dispense:  30 capsule    Refill:  0    Order Specific Question:   Supervising Provider    Answer:   ERVIN, MICHAEL L [[id number]]  . oxyCODONE (ROXICODONE) 5 MG immediate release tablet    Sig: Take 1 tablet (5 mg total) by mouth every 8 (eight) hours as needed for up to 5 days for breakthrough pain.    Dispense:  15 tablet    Refill:  0    Order Specific Question:   Supervising Provider    Answer:   Arlina Robes L [[id number]]    MDM FHT present via doppler  Pt reports right flank pain consistent with history of kidney stones. No CVA tenderness & afebrile. U/a  shows no signs of infection but does have hemoglobin; will send for culture.  Labs normal for pregnancy.  Renal ultrasound shows right hydronephrosis. Patient appears comfortable after tylenol & flomax. Will d/c with flomax rx & small amount of roxicodone for breakthrough pain. Patient instructed to follow up with urology as needed or MAU for worsening symptoms.   ASSESSMENT 1. Pregnancy with nephrolithiasis in second trimester   2. Right flank pain   3. [redacted] weeks gestation of pregnancy   4. Hematuria, gross   5. Acute right flank pain     PLAN Discharge home in stable condition. Rx roxicodone #15 Rx flomax F/u with urology Return to MAU for worsening symptoms, fever, or vomiting Urine culture pending    Follow-up Information    [facility] Follow up.   Contact information: [place] [phone]             Allergies as of 04/15/2020      Reactions   Pineapple    Oral itching and swelling      Medication List    STOP taking these medications   Doxylamine-Pyridoxine 10-10 MG Tbec Commonly known as: Diclegis     TAKE these medications   Blood Pressure Kit Devi 1 kit by Does not apply route as needed.   multivitamin with minerals tablet Take 1 tablet by mouth daily.   oxyCODONE 5 MG immediate release tablet Commonly known as: Roxicodone Take 1 tablet (5 mg total) by mouth every 8 (eight) hours as needed for up to 5 days for breakthrough pain.   Prenate Mini 29-0.6-0.4-350 MG Caps Take 1 capsule by mouth daily before breakfast.   tamsulosin 0.4 MG Caps capsule Commonly known as: FLOMAX Take 1 capsule (0.4 mg total) by mouth daily.        Jorje Guild, NP 04/15/2020  4:22 PM

## 2020-04-15 NOTE — MAU Note (Signed)
Samantha Carroll is a 24 y.o. at [redacted]w[redacted]d here in MAU reporting: R flank pain that seems worse with laying down or walking.  Does not hurt when palpated.  Also c/o difficulty urinating.  Onset of complaint: 3 days ago Pain score: 6     FHT: 145 Lab orders placed from triage: UA

## 2020-04-16 LAB — CULTURE, OB URINE: Culture: <10000 — AB

## 2020-04-19 ENCOUNTER — Telehealth: Payer: Self-pay | Admitting: Genetic Counselor

## 2020-04-19 LAB — MATERNIT21 PLUS CORE+SCA
Fetal Fraction: 11
Monosomy X (Turner Syndrome): NOT DETECTED
Result (T21): NEGATIVE
Trisomy 13 (Patau syndrome): NEGATIVE
Trisomy 18 (Edwards syndrome): NEGATIVE
Trisomy 21 (Down syndrome): NEGATIVE
XXX (Triple X Syndrome): NOT DETECTED
XXY (Klinefelter Syndrome): NOT DETECTED
XYY (Jacobs Syndrome): NOT DETECTED

## 2020-04-19 NOTE — Telephone Encounter (Signed)
LVM for Ms. Brocksmith re: good news about screening results. Requested a call back to my direct line to discuss these in more detail, as no identifiers were provided in voicemail message.   Gershon Crane, MS, [facility] Genetic Counselor

## 2020-04-21 ENCOUNTER — Telehealth: Payer: Self-pay | Admitting: Genetic Counselor

## 2020-04-21 NOTE — Telephone Encounter (Signed)
Attempted to call Ms. Dacosta again to discuss her negative noninvasive prenatal screening (NIPS) results; however, the call still went to voicemail. I left her a message asking her to call me back to discuss these results in detail.  Gershon Crane, MS, [facility] Genetic Counselor

## 2020-04-27 ENCOUNTER — Telehealth (INDEPENDENT_AMBULATORY_CARE_PROVIDER_SITE_OTHER): Payer: Medicaid Other | Admitting: Women's Health

## 2020-04-27 ENCOUNTER — Encounter: Payer: Self-pay | Admitting: Women's Health

## 2020-04-27 VITALS — BP 116/69 | HR 88

## 2020-04-27 DIAGNOSIS — Z3480 Encounter for supervision of other normal pregnancy, unspecified trimester: Secondary | ICD-10-CM

## 2020-04-27 DIAGNOSIS — K439 Ventral hernia without obstruction or gangrene: Secondary | ICD-10-CM

## 2020-04-27 DIAGNOSIS — F4321 Adjustment disorder with depressed mood: Secondary | ICD-10-CM

## 2020-04-27 DIAGNOSIS — O99612 Diseases of the digestive system complicating pregnancy, second trimester: Secondary | ICD-10-CM

## 2020-04-27 DIAGNOSIS — Z3A21 21 weeks gestation of pregnancy: Secondary | ICD-10-CM

## 2020-04-27 DIAGNOSIS — O99342 Other mental disorders complicating pregnancy, second trimester: Secondary | ICD-10-CM

## 2020-04-27 NOTE — Patient Instructions (Addendum)
Maternity Assessment Unit (MAU)  The Maternity Assessment Unit (MAU) is located at the [facility] [place] [facility]. The address is: [address] [place], [place], [place] [address]. Please see map below for additional directions.    The Maternity Assessment Unit is designed to help you during your pregnancy, and for up to 6 weeks after delivery, with any pregnancy- or postpartum-related emergencies, if you think you are in labor, or if your water has broken. For example, if you experience nausea and vomiting, vaginal bleeding, severe abdominal or pelvic pain, elevated blood pressure or other problems related to your pregnancy or postpartum time, please come to the Maternity Assessment Unit for assistance.        Preterm Labor and Birth Information  The normal length of a pregnancy is 39-41 weeks. Preterm labor is when labor starts before 37 completed weeks of pregnancy. What are the risk factors for preterm labor? Preterm labor is more likely to occur in women who:  Have certain infections during pregnancy such as a bladder infection, sexually transmitted infection, or infection inside the uterus (chorioamnionitis).  Have a shorter-than-normal cervix.  Have gone into preterm labor before.  Have had surgery on their cervix.  Are younger than age 64 or older than age 26.  Are African American.  Are pregnant with twins or multiple babies (multiple gestation).  Take street drugs or smoke while pregnant.  Do not gain enough weight while pregnant.  Became pregnant shortly after having been pregnant. What are the symptoms of preterm labor? Symptoms of preterm labor include:  Cramps similar to those that can happen during a menstrual period. The cramps may happen with diarrhea.  Pain in the abdomen or lower back.  Regular uterine contractions that may feel like tightening of the abdomen.  A feeling of increased pressure in the  pelvis.  Increased watery or bloody mucus discharge from the vagina.  Water breaking (ruptured amniotic sac). Why is it important to recognize signs of preterm labor? It is important to recognize signs of preterm labor because babies who are born prematurely may not be fully developed. This can put them at an increased risk for:  Long-term (chronic) heart and lung problems.  Difficulty immediately after birth with regulating body systems, including blood sugar, body temperature, heart rate, and breathing rate.  Bleeding in the brain.  Cerebral palsy.  Learning difficulties.  Death. These risks are highest for babies who are born before 38 weeks of pregnancy. How is preterm labor treated? Treatment depends on the length of your pregnancy, your condition, and the health of your baby. It may involve:  Having a stitch (suture) placed in your cervix to prevent your cervix from opening too early (cerclage).  Taking or being given medicines, such as: ? Hormone medicines. These may be given early in pregnancy to help support the pregnancy. ? Medicine to stop contractions. ? Medicines to help mature the baby's lungs. These may be prescribed if the risk of delivery is high. ? Medicines to prevent your baby from developing cerebral palsy. If the labor happens before 34 weeks of pregnancy, you may need to stay in the hospital. What should I do if I think I am in preterm labor? If you think that you are going into preterm labor, call your health care provider right away. How can I prevent preterm labor in future pregnancies? To increase your chance of having a full-term pregnancy:  Do not use any tobacco products, such as  cigarettes, chewing tobacco, and e-cigarettes. If you need help quitting, ask your health care provider.  Do not use street drugs or medicines that have not been prescribed to you during your pregnancy.  Talk with your health care provider before taking any herbal  supplements, even if you have been taking them regularly.  Make sure you gain a healthy amount of weight during your pregnancy.  Watch for infection. If you think that you might have an infection, get it checked right away.  Make sure to tell your health care provider if you have gone into preterm labor before. This information is not intended to replace advice given to you by your health care provider. Make sure you discuss any questions you have with your health care provider. Document Revised: 12/05/2018 Document Reviewed: 01/04/2016 Elsevier Patient Education  2020 Elsevier Inc.       Perinatal Depression When a woman feels excessive sadness, anger, or anxiety during pregnancy or during the first 12 months after she gives birth, she has a condition called perinatal depression. Depression can interfere with work, school, relationships, and other everyday activities. If it is not managed properly, it can also cause problems in the mother and her baby. Sometimes, perinatal depression is left untreated because symptoms are thought to be normal mood swings during and right after pregnancy. If you have symptoms of depression, it is important to talk with your health care provider. What are the causes? The exact cause of this condition is not known. Hormonal changes during and after pregnancy may play a role in causing perinatal depression. What increases the risk? You are more likely to develop this condition if:  You have a personal or family history of depression, anxiety, or mood disorders.  You experience a stressful life event during pregnancy, such as the death of a loved one.  You have a lot of regular life stress.  You do not have support from family members or loved ones, or you are in an abusive relationship. What are the signs or symptoms? Symptoms of this condition include:  Feeling sad or hopeless.  Feelings of guilt.  Feeling irritable or overwhelmed.  Changes in  your appetite.  Lack of energy or motivation.  Sleep problems.  Difficulty concentrating or completing tasks.  Loss of interest in hobbies or relationships.  Headaches or stomach problems that do not go away. How is this diagnosed? This condition is diagnosed based on a physical exam and mental evaluation. In some cases, your health care provider may use a depression screening tool. These tools include a list of questions that can help a health care provider diagnose depression. Your health care provider may refer you to a mental health expert who specializes in depression. How is this treated? This condition may be treated with:  Medicines. Your health care provider will only give you medicines that have been proven safe for pregnancy and breastfeeding.  Talk therapy with a mental health professional to help change your patterns of thinking (cognitive behavioral therapy).  Support groups.  Brain stimulation or light therapies.  Stress reduction therapies, such as mindfulness. Follow these instructions at home: Lifestyle  Do not use any products that contain nicotine or tobacco, such as cigarettes and e-cigarettes. If you need help quitting, ask your health care provider.  Do not use alcohol when you are pregnant. After your baby is born, limit alcohol intake to no more than 1 drink a day. One drink equals 12 oz of beer, 5 oz of wine,  or 1 oz of hard liquor.  Consider joining a support group for new mothers. Ask your health care provider for recommendations.  Take good care of yourself. Make sure you: ? Get plenty of sleep. If you are having trouble sleeping, talk with your health care provider. ? Eat a healthy diet. This includes plenty of fruits and vegetables, whole grains, and lean proteins. ? Exercise regularly, as told by your health care provider. Ask your health care provider what exercises are safe for you. General instructions  Take over-the-counter and prescription  medicines only as told by your health care provider.  Talk with your partner or family members about your feelings during pregnancy. Share any concerns or anxieties that you may have.  Ask for help with tasks or chores when you need it. Ask friends and family members to provide meals, watch your children, or help with cleaning.  Keep all follow-up visits as told by your health care provider. This is important. Contact a health care provider if:  You (or people close to you) notice that you have any symptoms of depression.  You have depression and your symptoms get worse.  You experience side effects from medicines, such as nausea or sleep problems. Get help right away if:  You feel like hurting yourself, your baby, or someone else. If you ever feel like you may hurt yourself or others, or have thoughts about taking your own life, get help right away. You can go to your nearest emergency department or call:  Your local emergency services (911 in the [place]  A suicide crisis helpline, such as the National Suicide Prevention Lifeline at 501-377-0643. This is open 24 hours a day. Summary  Perinatal depression is when a woman feels excessive sadness, anger, or anxiety during pregnancy or during the first 12 months after she gives birth.  If perinatal depression is not treated, it can lead to health problems for the mother and her baby.  This condition is treated with medicines, talk therapy, stress reduction therapies, or a combination of two or more treatments.  Talk with your partner or family members about your feelings. Do not be afraid to ask for help. This information is not intended to replace advice given to you by your health care provider. Make sure you discuss any questions you have with your health care provider. Document Revised: 01/28/2019 Document Reviewed: 10/10/2016 Elsevier Patient Education  2020 Elsevier Inc.        Perinatal Anxiety When a woman feels  excessive tension or worry (anxiety) during pregnancy or during the first 12 months after she gives birth, she has a condition called perinatal anxiety. Anxiety can interfere with work, school, relationships, and other everyday activities. If it is not managed properly, it can also cause problems in the mother and her baby.  If you are pregnant and you have symptoms of an anxiety disorder, it is important to talk with your health care provider. What are the causes? The exact cause of this condition is not known. Hormonal changes during and after pregnancy may play a role in causing perinatal anxiety. What increases the risk? You are more likely to develop this condition if:  You have a personal or family history of depression, anxiety, or mood disorders.  You experience a stressful life event during pregnancy, such as the death of a loved one.  You have a lot of regular life stress, such as being a single parent.  You have thyroid problems. What are the signs or  symptoms? Perinatal anxiety can be different for everyone. It may include:  Panic attacks (panic disorder). These are intense episodes of fear or discomfort that may also cause sweating, nausea, shortness of breath, or fear of dying. They usually last 5-15 minutes.  Reliving an upsetting (traumatic) event through distressing thoughts, dreams, or flashbacks (post-traumatic stress disorder, or PTSD).  Excessive worry about multiple problems (generalized anxiety disorder).  Fear and stress about leaving certain people or loved ones (separation anxiety).  Performing repetitive tasks (compulsions) to relieve stress or worry (obsessive compulsive disorder, or OCD).  Fear of certain objects or situations (phobias).  Excessive worrying, such as a constant feeling that something bad is going to happen.  Inability to relax.  Difficulty concentrating.  Sleep problems.  Frequent nightmares or disturbing thoughts. How is this  diagnosed? This condition is diagnosed based on a physical exam and mental evaluation. In some cases, your health care provider may use an anxiety screening tool. These tools include a list of questions that can help a health care provider diagnose anxiety. Your health care provider may refer you to a mental health expert who specializes in anxiety. How is this treated? This condition may be treated with:  Medicines. Your health care provider will only give you medicines that have been proven safe for pregnancy and breastfeeding.  Talk therapy with a mental health professional to help change your patterns of thinking (cognitive behavioral therapy).  Mindfulness-based stress reduction.  Other relaxation therapies, such as deep breathing or guided muscle relaxation.  Support groups. Follow these instructions at home: Lifestyle  Do not use any products that contain nicotine or tobacco, such as cigarettes and e-cigarettes. If you need help quitting, ask your health care provider.  Do not use alcohol when you are pregnant. After your baby is born, limit alcohol intake to no more than 1 drink a day. One drink equals 12 oz of beer, 5 oz of wine, or 1 oz of hard liquor.  Consider joining a support group for new mothers. Ask your health care provider for recommendations.  Take good care of yourself. Make sure you: ? Get plenty of sleep. If you are having trouble sleeping, talk with your health care provider. ? Eat a healthy diet. This includes plenty of fruits and vegetables, whole grains, and lean proteins. ? Exercise regularly, as told by your health care provider. Ask your health care provider what exercises are safe for you. General instructions  Take over-the-counter and prescription medicines only as told by your health care provider.  Talk with your partner or family members about your feelings during pregnancy. Share any concerns or fears that you may have.  Ask for help with tasks  or chores when you need it. Ask friends and family members to provide meals, watch your children, or help with cleaning.  Keep all follow-up visits as told by your health care provider. This is important. Contact a health care provider if:  You (or people close to you) notice that you have any symptoms of anxiety or depression.  You have anxiety and your symptoms get worse.  You experience side effects from medicines, such as nausea or sleep problems. Get help right away if:  You feel like hurting yourself, your baby, or someone else. If you ever feel like you may hurt yourself or others, or have thoughts about taking your own life, get help right away. You can go to your nearest emergency department or call:  Your local emergency services (911 in  the [place]  A suicide crisis helpline, such as the National Suicide Prevention Lifeline at 780-275-18561-(856) 021-3760. This is open 24 hours a day. Summary  Perinatal anxiety is when a woman feels excessive tension or worry during pregnancy or during the first 12 months after she gives birth.  Perinatal anxiety may include panic attacks, post-traumatic stress disorder, separation anxiety, phobias, or generalized anxiety.  Perinatal anxiety can cause physical health problems in the mother and baby if not properly managed.  This condition is treated with medicines, talk therapy, stress reduction therapies, or a combination of two or more treatments.  Talk with your partner or family members about your concerns or fears. Do not be afraid to ask for help. This information is not intended to replace advice given to you by your health care provider. Make sure you discuss any questions you have with your health care provider. Document Revised: 08/16/2017 Document Reviewed: 10/10/2016 Elsevier Patient Education  2020 [employer]

## 2020-04-27 NOTE — Progress Notes (Signed)
I connected with Samantha Carroll 04/27/20 at  9:35 AM EDT by: MyChart video and verified that I am speaking with the correct person using two identifiers.  Patient is located at home and provider is located at [facility].     The purpose of this virtual visit is to provide medical care while limiting exposure to the novel coronavirus. I discussed the limitations, risks, security and privacy concerns of performing an evaluation and management service by MyChart video and the availability of in person appointments. I also discussed with the patient that there may be a patient responsible charge related to this service. By engaging in this virtual visit, you consent to the provision of healthcare.  Additionally, you authorize for your insurance to be billed for the services provided during this visit.  The patient expressed understanding and agreed to proceed.  The following staff members participated in the virtual visit:  Donia Ast, NP, Job Founds, SNP    PRENATAL VISIT NOTE  Subjective:  Samantha Carroll is a 24 y.o. [phone] at [redacted]w[redacted]d  for phone visit for ongoing prenatal care.  She is currently monitored for the following issues for this low-risk pregnancy and has Adjustment disorder with depressed mood; Supervision of other normal pregnancy, antepartum; and History of nephrolithiasis on their problem list.  Patient reports knot on belly button.  Contractions: Not present. Vag. Bleeding: None.  Movement: Present. Denies leaking of fluid.   The following portions of the patient's history were reviewed and updated as appropriate: allergies, current medications, past family history, past medical history, past social history, past surgical history and problem list.   Objective:   Vitals:   04/27/20 0944  BP: 116/69  Pulse: 88   Self-Obtained  Fetal Status:     Movement: Present     Assessment and Plan:  Pregnancy: [id number] at [redacted]w[redacted]d  1. Supervision of other normal pregnancy,  antepartum  2. Adjustment disorder with depressed mood -pt reports she does still experience depression and spoke with [facility] about 6 weeks ago, but would like to have recurring appointments -referral for [facility] entered  3. Hernia of abdominal wall -pt reports for the past few days has felt a knot coming out of her belly button only when she coughs that reduces after the cough has passed -warning signs of incarcerated hernia discussed, pt advised to be seen emergently if those occur  Preterm labor symptoms and general obstetric precautions including but not limited to vaginal bleeding, contractions, leaking of fluid and fetal movement were reviewed in detail with the patient. I discussed the assessment and treatment plan with the patient. The patient was provided an opportunity to ask questions and all were answered. The patient agreed with the plan and demonstrated an understanding of the instructions. The patient was advised to call back or seek an in-person office evaluation/go to MAU at [facility] for any urgent or concerning symptoms.  Return in about 4 weeks (around 05/25/2020) for in-person LOB/APP OK/GTT/labs, needs BH appt ASAP.  No future appointments.   Time spent on virtual visit: 12 minutes  Marylen Ponto, NP

## 2020-04-29 ENCOUNTER — Telehealth: Payer: Self-pay | Admitting: Genetic Counselor

## 2020-04-29 NOTE — Telephone Encounter (Signed)
Second year [employer] genetic counseling student Rennis Harding called Ms. Symmonds under my supervision and LVM re: good news about screening results. Requested a call back to my direct line to discuss these in more detail, as no identifiers were provided in voicemail message. I will send a message to Venia Carbon, NP to see if she can discuss these results with Ms. Volpe at her next [facility] appointment.   Gershon Crane, MS, [facility] Genetic Counselor

## 2020-05-03 ENCOUNTER — Encounter: Payer: Medicaid Other | Admitting: Licensed Clinical Social Worker

## 2020-05-10 ENCOUNTER — Ambulatory Visit: Payer: Medicaid Other

## 2020-05-10 ENCOUNTER — Other Ambulatory Visit (HOSPITAL_COMMUNITY)
Admission: RE | Admit: 2020-05-10 | Discharge: 2020-05-10 | Disposition: A | Discharge status: Home / Self Care | Payer: Medicaid Other | Source: Ambulatory Visit | Attending: Obstetrics and Gynecology | Admitting: Obstetrics and Gynecology

## 2020-05-10 ENCOUNTER — Other Ambulatory Visit: Payer: Self-pay

## 2020-05-10 DIAGNOSIS — N898 Other specified noninflammatory disorders of vagina: Secondary | ICD-10-CM

## 2020-05-10 DIAGNOSIS — A549 Gonococcal infection, unspecified: Secondary | ICD-10-CM

## 2020-05-10 MED ORDER — TERCONAZOLE 0.8 % VA CREA: 1.0000 | TOPICAL_CREAM | Freq: Every day | VAGINAL | 0 refills | Status: DC

## 2020-05-10 NOTE — Progress Notes (Signed)
SUBJECTIVE:  24 y.o. female complains of vaginal discharge since last swab and treatment for +GC.  Denies abnormal vaginal bleeding or significant pelvic pain or fever. No UTI symptoms.   Patient's last menstrual period was 12/18/2019 (approximate).  OBJECTIVE:  She appears well, afebrile. Urine dipstick: not done.  ASSESSMENT:  Vaginal Discharge  Vaginal Itching    PLAN:  GC, chlamydia, trichomonas, BVAG, CVAG probe sent to lab. Treatment: To be determined once lab results are received ROV prn if symptoms persist or worsen. Pt requested vaginal cream Rx for itching Rx sent per protocol for possible yeast infection

## 2020-05-10 NOTE — Progress Notes (Signed)
Patient was assessed and managed by nursing staff during this encounter. I have reviewed the chart and agree with the documentation and plan. I have also made any necessary editorial changes.  Jaynie Collins, MD 05/10/2020 2:41 PM

## 2020-05-11 LAB — CERVICOVAGINAL ANCILLARY ONLY
Bacterial Vaginitis (gardnerella): NEGATIVE
Candida Glabrata: NEGATIVE
Candida Vaginitis: POSITIVE — AB
Chlamydia: NEGATIVE
Comment: NEGATIVE
Comment: NEGATIVE
Comment: NEGATIVE
Comment: NEGATIVE
Comment: NEGATIVE
Comment: NORMAL
Neisseria Gonorrhea: NEGATIVE
Trichomonas: NEGATIVE

## 2020-05-25 ENCOUNTER — Ambulatory Visit (INDEPENDENT_AMBULATORY_CARE_PROVIDER_SITE_OTHER): Payer: Medicaid Other | Admitting: Obstetrics and Gynecology

## 2020-05-25 ENCOUNTER — Other Ambulatory Visit: Payer: Self-pay

## 2020-05-25 VITALS — BP 130/74 | HR 101 | Wt 183.4 lb

## 2020-05-25 DIAGNOSIS — Z7189 Other specified counseling: Secondary | ICD-10-CM | POA: Diagnosis not present

## 2020-05-25 DIAGNOSIS — Z3482 Encounter for supervision of other normal pregnancy, second trimester: Secondary | ICD-10-CM

## 2020-05-25 DIAGNOSIS — Z3480 Encounter for supervision of other normal pregnancy, unspecified trimester: Secondary | ICD-10-CM

## 2020-05-25 DIAGNOSIS — Z3A25 25 weeks gestation of pregnancy: Secondary | ICD-10-CM

## 2020-05-25 DIAGNOSIS — H539 Unspecified visual disturbance: Secondary | ICD-10-CM

## 2020-05-25 MED ORDER — FAMOTIDINE 20 MG PO TABS: 20.0000 mg | ORAL_TABLET | Freq: Two times a day (BID) | ORAL | 1 refills | Status: DC

## 2020-05-25 NOTE — Addendum Note (Signed)
Addended by: Venia Carbon I on: 05/25/2020 03:40 PM   Modules accepted: Orders

## 2020-05-25 NOTE — Progress Notes (Signed)
° °  PRENATAL VISIT NOTE  Subjective:  Samantha Carroll is a 24 y.o. [phone] at [redacted]w[redacted]d being seen today for ongoing prenatal care.  She is currently monitored for the following issues for this low-risk pregnancy and has Adjustment disorder with depressed mood; Supervision of other normal pregnancy, antepartum; History of nephrolithiasis; and Changes in vision on their problem list.  Patient reports occasional floaters seen in vision. No HA, normal BP readings at home.   Contractions: Not present. Vag. Bleeding: None.  Movement: Present. Denies leaking of fluid.   The following portions of the patient's history were reviewed and updated as appropriate: allergies, current medications, past family history, past medical history, past social history, past surgical history and problem list.   Objective:   Vitals:   05/25/20 1359  BP: 130/74  Pulse: (!) 101  Weight: 183 lb 6.4 oz (83.2 kg)    Fetal Status:   Fundal Height: 26 cm Movement: Present     General:  Alert, oriented and cooperative. Patient is in no acute distress.  Skin: Skin is warm and dry. No rash noted.   Cardiovascular: Normal heart rate noted  Respiratory: Normal respiratory effort, no problems with respiration noted  Abdomen: Soft, gravid, appropriate for gestational age.  Pain/Pressure: Absent     Pelvic: Cervical exam deferred        Extremities: Normal range of motion.  Edema: None  Mental Status: Normal mood and affect. Normal behavior. Normal judgment and thought content.   Assessment and Plan:  Pregnancy: [id number] at [redacted]w[redacted]d  1. Changes in vision  - Bp normal today, and reports normal at home.  - baseline labs today - recommend she schedule annual eye appointment. Reports not having one in a while  - Protein / creatinine ratio, urine - CBC w/Diff - Comp Met (CMET)  2. Supervision of other normal pregnancy, antepartum  - doing well - Declined Covid vaccine. "doesn't think it works"  COVID-19 Vaccine  Counseling: The patient was counseled on the potential benefits and lack of known risks of COVID vaccination, during pregnancy and breastfeeding, during today's visit. The patient's questions and concerns were addressed today, including safety of the vaccination and potential side effects as they have been published by ACOG and SM[facility]. The patient has been informed that there have not been any documented vaccine related injuries, deaths or birth defects to infant or mom after receiving the COVID-19 vaccine to date. The patient has been made aware that although she is not at increased risk of contracting COVID-19 during pregnancy, she is at increased risk of developing severe disease and complications if she contracts COVID-19 while pregnant. All patient questions were addressed during our visit today. The patient is not planning to get vaccinated at this time.   Preterm labor symptoms and general obstetric precautions including but not limited to vaginal bleeding, contractions, leaking of fluid and fetal movement were reviewed in detail with the patient. Please refer to After Visit Summary for other counseling recommendations.   Return in about 4 weeks (around 06/22/2020) for For 2 hour GTT, in-person, low risk OB .  No future appointments.  Noni Saupe, NP

## 2020-05-26 LAB — COMPREHENSIVE METABOLIC PANEL
ALT: 7 IU/L (ref 0–32)
AST: 10 IU/L (ref 0–40)
Albumin/Globulin Ratio: 1.6 (ref 1.2–2.2)
Albumin: 3.6 g/dL — ABNORMAL LOW (ref 3.9–5.0)
Alkaline Phosphatase: 71 IU/L (ref 44–121)
BUN/Creatinine Ratio: 13 (ref 9–23)
BUN: 7 mg/dL (ref 6–20)
Bilirubin Total: <0.2 mg/dL (ref 0.0–1.2)
CO2: 20 mmol/L (ref 20–29)
Calcium: 9.2 mg/dL (ref 8.7–10.2)
Chloride: 104 mmol/L (ref 96–106)
Creatinine, Ser: 0.54 mg/dL — ABNORMAL LOW (ref 0.57–1.00)
GFR calc Af Amer: 152 mL/min/{1.73_m2} (ref >59)
GFR calc non Af Amer: 132 mL/min/{1.73_m2} (ref >59)
Globulin, Total: 2.3 g/dL (ref 1.5–4.5)
Glucose: 99 mg/dL (ref 65–99)
Potassium: 4 mmol/L (ref 3.5–5.2)
Sodium: 135 mmol/L (ref 134–144)
Total Protein: 5.9 g/dL — ABNORMAL LOW (ref 6.0–8.5)

## 2020-05-26 LAB — PROTEIN / CREATININE RATIO, URINE
Creatinine, Urine: 98 mg/dL
Protein, Ur: 9.7 mg/dL
Protein/Creat Ratio: 99 mg/g creat (ref 0–200)

## 2020-05-26 LAB — CBC WITH DIFFERENTIAL/PLATELET
Basophils Absolute: 0.1 10*3/uL (ref 0.0–0.2)
Basos: 1 %
EOS (ABSOLUTE): 0.2 10*3/uL (ref 0.0–0.4)
Eos: 2 %
Hematocrit: 30.3 % — ABNORMAL LOW (ref 34.0–46.6)
Hemoglobin: 10.3 g/dL — ABNORMAL LOW (ref 11.1–15.9)
Immature Grans (Abs): 0 10*3/uL (ref 0.0–0.1)
Immature Granulocytes: 0 %
Lymphocytes Absolute: 1.7 10*3/uL (ref 0.7–3.1)
Lymphs: 18 %
MCH: 29.2 pg (ref 26.6–33.0)
MCHC: 34 g/dL (ref 31.5–35.7)
MCV: 86 fL (ref 79–97)
Monocytes Absolute: 0.5 10*3/uL (ref 0.1–0.9)
Monocytes: 5 %
Neutrophils Absolute: 7.1 10*3/uL — ABNORMAL HIGH (ref 1.4–7.0)
Neutrophils: 74 %
Platelets: 264 10*3/uL (ref 150–450)
RBC: 3.53 x10E6/uL — ABNORMAL LOW (ref 3.77–5.28)
RDW: 13.5 % (ref 11.7–15.4)
WBC: 9.5 10*3/uL (ref 3.4–10.8)

## 2020-05-30 ENCOUNTER — Ambulatory Visit (INDEPENDENT_AMBULATORY_CARE_PROVIDER_SITE_OTHER): Payer: Medicaid Other | Admitting: Licensed Clinical Social Worker

## 2020-05-30 DIAGNOSIS — Z3A00 Weeks of gestation of pregnancy not specified: Secondary | ICD-10-CM

## 2020-05-30 DIAGNOSIS — O99340 Other mental disorders complicating pregnancy, unspecified trimester: Secondary | ICD-10-CM

## 2020-05-30 DIAGNOSIS — F32A Depression, unspecified: Secondary | ICD-10-CM | POA: Diagnosis not present

## 2020-06-01 NOTE — BH Specialist Note (Signed)
Integrated Behavioral Health via Telemedicine Video (Caregility) Visit  06/01/2020 Samantha Carroll [id number]  Number of Integrated Behavioral Health visits: 2 Session Start time: 3:15pm Session End time: 3:40pm Total time:  25 minutes via mychart   Referring Provider: Farris Has NP Type of Service: Individual Patient/Family location: Home  [facility] Provider location: [facility]  All persons participating in visit: Pt Samantha Carroll and LCSWA Samantha Carroll    I connected with Samantha Carroll and/or Samantha Carroll n/a by a video enabled telemedicine application (Caregility) and verified that I am speaking with the correct person using two identifiers.   Discussed confidentiality: yes  Confirmed demographics & insurance:  no  I discussed that engaging in this virtual visit, they consent to the provision of behavioral healthcare and the services will be billed under their insurance.   Patient and/or legal guardian expressed understanding and consented to virtual visit: yes   PRESENTING CONCERNS: Patient and/or family reports the following symptoms/concerns: history of depression, depressed mood, social isolation and feeling of guilt  Duration of problem: adolescence  ; Severity of problem: moderate   STRENGTHS (Protective Factors/Coping Skills): Samantha Carroll currently shares an apartment with her sister and works full time at [employer]  ASSESSMENT: Patient currently experiencing depressed mood and social isolation. SamanthaCarroll reports strained relationships with father of two older children, and abandonment from mother have contributed to the mentioned emotions. Samantha Carroll reports she communicates with adoptive mother frequently. Samantha Carroll reports she prefer to stay alone and focus on her pending trespassing court case. Samantha Carroll appears healthy and does not endorse any current thoughts of self harm.  Samantha Carroll reports she does not receive counseling with an outside  agency and is not currently prescribed psychotropic medication.   GOALS ADDRESSED: Patient will: 1.  Reduce symptoms of: depression   2.  Increase knowledge and/or ability of:  Implement coping skills to alleviate symptoms  3.  Demonstrate ability to: self manage symptoms        INTERVENTIONS: Interventions utilized:  Supportive counseling  Standardized Assessments completed & reviewed:    Initial Prenatal from 03/30/2020 in [facility]  PHQ-9 Total Score 22          PLAN: 1. Follow up with behavioral health clinician on : 3 weeks via mychart  2. Behavioral recommendations: attend all schedule medical and behavioral health appts, prioritize sleep, start journal writing and implement mindfulness technique 5-10 a day to alleviate symptoms 3. Referral(s):   I discussed the assessment and treatment plan with the patient and/or parent/guardian. They were provided an opportunity to ask questions and all were answered. They agreed with the plan and demonstrated an understanding of the instructions.   They were advised to call back or seek an in-person evaluation as appropriate.  I discussed that the purpose of this visit is to provide behavioral health care while limiting exposure to the novel coronavirus.  Discussed there is a possibility of technology failure and discussed alternative modes of communication if that failure occurs.  Samantha Carroll

## 2020-06-22 ENCOUNTER — Encounter: Payer: Self-pay | Admitting: Obstetrics and Gynecology

## 2020-06-22 ENCOUNTER — Other Ambulatory Visit: Payer: Medicaid Other

## 2020-06-22 ENCOUNTER — Other Ambulatory Visit: Payer: Self-pay

## 2020-06-22 ENCOUNTER — Ambulatory Visit (INDEPENDENT_AMBULATORY_CARE_PROVIDER_SITE_OTHER): Payer: Medicaid Other | Admitting: Obstetrics and Gynecology

## 2020-06-22 VITALS — BP 115/71 | HR 100 | Wt 184.6 lb

## 2020-06-22 DIAGNOSIS — Z87442 Personal history of urinary calculi: Secondary | ICD-10-CM

## 2020-06-22 DIAGNOSIS — F4321 Adjustment disorder with depressed mood: Secondary | ICD-10-CM

## 2020-06-22 DIAGNOSIS — Z3480 Encounter for supervision of other normal pregnancy, unspecified trimester: Secondary | ICD-10-CM

## 2020-06-22 DIAGNOSIS — Z3A29 29 weeks gestation of pregnancy: Secondary | ICD-10-CM | POA: Insufficient documentation

## 2020-06-22 NOTE — Progress Notes (Signed)
Pt is here for ROB, [redacted]w[redacted]d.

## 2020-06-22 NOTE — Progress Notes (Signed)
   PRENATAL VISIT NOTE  Subjective:  Samantha Carroll is a 24 y.o. [phone] at [redacted]w[redacted]d being seen today for ongoing prenatal care.  She is currently monitored for the following issues for this low-risk pregnancy and has Adjustment disorder with depressed mood; Supervision of other normal pregnancy, antepartum; History of nephrolithiasis; Changes in vision; and [redacted] weeks gestation of pregnancy on their problem list.  Patient reports no complaints.  Contractions: Not present. Vag. Bleeding: None.  Movement: Present. Denies leaking of fluid.   The following portions of the patient's history were reviewed and updated as appropriate: allergies, current medications, past family history, past medical history, past social history, past surgical history and problem list.   Objective:   Vitals:   06/22/20 0816  BP: 115/71  Pulse: 100  Weight: 184 lb 9.6 oz (83.7 kg)    Fetal Status: Fetal Heart Rate (bpm): 145   Movement: Present     General:  Alert, oriented and cooperative. Patient is in no acute distress.  Skin: Skin is warm and dry. No rash noted.   Cardiovascular: Normal heart rate noted  Respiratory: Normal respiratory effort, no problems with respiration noted  Abdomen: Soft, gravid, appropriate for gestational age.  Pain/Pressure: Absent     Pelvic: Cervical exam deferred        Extremities: Normal range of motion.  Edema: None  Mental Status: Normal mood and affect. Normal behavior. Normal judgment and thought content.   Assessment and Plan:  Pregnancy: [id number] at [redacted]w[redacted]d 1. Supervision of other normal pregnancy, antepartum, [redacted] weeks gestation of pregnancy: Pt doing well today. No reported concerns or red flag symptoms. She is planning to adopt this baby. Continued on prenatal vitamins with good adherence. Appropriate fundal height and +FHTs today. BP wnl. - Glucose Tolerance, 2 Hours w/1 Hour - CBC - HIV antibody (with reflex) - RPR - pt declined Tdap & flu vaccines (plan to  re-address at f/u appt) - RTC 2 weeks for f/u prenatal visit; return precautions for preterm labor, decreased FM, PreE symptoms, and other concerns  2. History of nephrolithiasis: No urinary symptoms today.  3. Adjustment disorder with depressed mood: Pt seen by [facility] on 10/4. Pt planning to adopt this baby as noted above. No reported mood or safety concerns today. -provided strict return precautions for safety concerns  Preterm labor symptoms and general obstetric precautions including but not limited to vaginal bleeding, contractions, leaking of fluid and fetal movement were reviewed in detail with the patient. Please refer to After Visit Summary for other counseling recommendations.   Return in about 2 weeks (around 07/06/2020) for in-person prenatal f/u appt, any provider.  Future Appointments  Date Time Provider Department Center  07/06/2020 10:35 AM Burleson, Brand Males, NP [facility] None    Sheila Oats, MD

## 2020-06-22 NOTE — Patient Instructions (Addendum)
Third Trimester of Pregnancy The third trimester is from week 28 through week 40 (months 7 through 9). The third trimester is a time when the unborn baby (fetus) is growing rapidly. At the end of the ninth month, the fetus is about 20 inches in length and weighs 6-10 pounds. Body changes during your third trimester Your body will continue to go through many changes during pregnancy. The changes vary from woman to woman. During the third trimester:  Your weight will continue to increase. You can expect to gain 25-35 pounds (11-16 kg) by the end of the pregnancy.  You may begin to get stretch marks on your hips, abdomen, and breasts.  You may urinate more often because the fetus is moving lower into your pelvis and pressing on your bladder.  You may develop or continue to have heartburn. This is caused by increased hormones that slow down muscles in the digestive tract.  You may develop or continue to have constipation because increased hormones slow digestion and cause the muscles that push waste through your intestines to relax.  You may develop hemorrhoids. These are swollen veins (varicose veins) in the rectum that can itch or be painful.  You may develop swollen, bulging veins (varicose veins) in your legs.  You may have increased body aches in the pelvis, back, or thighs. This is due to weight gain and increased hormones that are relaxing your joints.  You may have changes in your hair. These can include thickening of your hair, rapid growth, and changes in texture. Some women also have hair loss during or after pregnancy, or hair that feels dry or thin. Your hair will most likely return to normal after your baby is born.  Your breasts will continue to grow and they will continue to become tender. A yellow fluid (colostrum) may leak from your breasts. This is the first milk you are producing for your baby.  Your belly button may stick out.  You may notice more swelling in your hands,  face, or ankles.  You may have increased tingling or numbness in your hands, arms, and legs. The skin on your belly may also feel numb.  You may feel short of breath because of your expanding uterus.  You may have more problems sleeping. This can be caused by the size of your belly, increased need to urinate, and an increase in your body's metabolism.  You may notice the fetus "dropping," or moving lower in your abdomen (lightening).  You may have increased vaginal discharge.  You may notice your joints feel loose and you may have pain around your pelvic bone. What to expect at prenatal visits You will have prenatal exams every 2 weeks until week 36. Then you will have weekly prenatal exams. During a routine prenatal visit:  You will be weighed to make sure you and the baby are growing normally.  Your blood pressure will be taken.  Your abdomen will be measured to track your baby's growth.  The fetal heartbeat will be listened to.  Any test results from the previous visit will be discussed.  You may have a cervical check near your due date to see if your cervix has softened or thinned (effaced).  You will be tested for Group B streptococcus. This happens between 35 and 37 weeks. Your health care provider may ask you:  What your birth plan is.  How you are feeling.  If you are feeling the baby move.  If you have had any abnormal   symptoms, such as leaking fluid, bleeding, severe headaches, or abdominal cramping.  If you are using any tobacco products, including cigarettes, chewing tobacco, and electronic cigarettes.  If you have any questions. Other tests or screenings that may be performed during your third trimester include:  Blood tests that check for low iron levels (anemia).  Fetal testing to check the health, activity level, and growth of the fetus. Testing is done if you have certain medical conditions or if there are problems during the pregnancy.  Nonstress test  (NST). This test checks the health of your baby to make sure there are no signs of problems, such as the baby not getting enough oxygen. During this test, a belt is placed around your belly. The baby is made to move, and its heart rate is monitored during movement. What is false labor? False labor is a condition in which you feel small, irregular tightenings of the muscles in the womb (contractions) that usually go away with rest, changing position, or drinking water. These are called Braxton Hicks contractions. Contractions may last for hours, days, or even weeks before true labor sets in. If contractions come at regular intervals, become more frequent, increase in intensity, or become painful, you should see your health care provider. What are the signs of labor?  Abdominal cramps.  Regular contractions that start at 10 minutes apart and become stronger and more frequent with time.  Contractions that start on the top of the uterus and spread down to the lower abdomen and back.  Increased pelvic pressure and dull back pain.  A watery or bloody mucus discharge that comes from the vagina.  Leaking of amniotic fluid. This is also known as your "water breaking." It could be a slow trickle or a gush. Let your health care provider know if it has a color or strange odor. If you have any of these signs, call your health care provider right away, even if it is before your due date. Follow these instructions at home: Medicines  Follow your health care provider's instructions regarding medicine use. Specific medicines may be either safe or unsafe to take during pregnancy.  Take a prenatal vitamin that contains at least 600 micrograms (mcg) of folic acid.  If you develop constipation, try taking a stool softener if your health care provider approves. Eating and drinking   Eat a balanced diet that includes fresh fruits and vegetables, whole grains, good sources of protein such as meat, eggs, or tofu,  and low-fat dairy. Your health care provider will help you determine the amount of weight gain that is right for you.  Avoid raw meat and uncooked cheese. These carry germs that can cause birth defects in the baby.  If you have low calcium intake from food, talk to your health care provider about whether you should take a daily calcium supplement.  Eat four or five small meals rather than three large meals a day.  Limit foods that are high in fat and processed sugars, such as fried and sweet foods.  To prevent constipation: ? Drink enough fluid to keep your urine clear or pale yellow. ? Eat foods that are high in fiber, such as fresh fruits and vegetables, whole grains, and beans. Activity  Exercise only as directed by your health care provider. Most women can continue their usual exercise routine during pregnancy. Try to exercise for 30 minutes at least 5 days a week. Stop exercising if you experience uterine contractions.  Avoid heavy lifting.  Do   not exercise in extreme heat or humidity, or at high altitudes.  Wear low-heel, comfortable shoes.  Practice good posture.  You may continue to have sex unless your health care provider tells you otherwise. Relieving pain and discomfort  Take frequent breaks and rest with your legs elevated if you have leg cramps or low back pain.  Take warm sitz baths to soothe any pain or discomfort caused by hemorrhoids. Use hemorrhoid cream if your health care provider approves.  Wear a good support bra to prevent discomfort from breast tenderness.  If you develop varicose veins: ? Wear support pantyhose or compression stockings as told by your healthcare provider. ? Elevate your feet for 15 minutes, 3-4 times a day. Prenatal care  Write down your questions. Take them to your prenatal visits.  Keep all your prenatal visits as told by your health care provider. This is important. Safety  Wear your seat belt at all times when driving.  Make  a list of emergency phone numbers, including numbers for family, friends, the hospital, and police and fire departments. General instructions  Avoid cat litter boxes and soil used by cats. These carry germs that can cause birth defects in the baby. If you have a cat, ask someone to clean the litter box for you.  Do not travel far distances unless it is absolutely necessary and only with the approval of your health care provider.  Do not use hot tubs, steam rooms, or saunas.  Do not drink alcohol.  Do not use any products that contain nicotine or tobacco, such as cigarettes and e-cigarettes. If you need help quitting, ask your health care provider.  Do not use any medicinal herbs or unprescribed drugs. These chemicals affect the formation and growth of the baby.  Do not douche or use tampons or scented sanitary pads.  Do not cross your legs for long periods of time.  To prepare for the arrival of your baby: ? Take prenatal classes to understand, practice, and ask questions about labor and delivery. ? Make a trial run to the hospital. ? Visit the hospital and tour the maternity area. ? Arrange for maternity or paternity leave through employers. ? Arrange for family and friends to take care of pets while you are in the hospital. ? Purchase a rear-facing car seat and make sure you know how to install it in your car. ? Pack your hospital bag. ? Prepare the baby's nursery. Make sure to remove all pillows and stuffed animals from the baby's crib to prevent suffocation.  Visit your dentist if you have not gone during your pregnancy. Use a soft toothbrush to brush your teeth and be gentle when you floss. Contact a health care provider if:  You are unsure if you are in labor or if your water has broken.  You become dizzy.  You have mild pelvic cramps, pelvic pressure, or nagging pain in your abdominal area.  You have lower back pain.  You have persistent nausea, vomiting, or  diarrhea.  You have an unusual or bad smelling vaginal discharge.  You have pain when you urinate. Get help right away if:  Your water breaks before 37 weeks.  You have regular contractions less than 5 minutes apart before 37 weeks.  You have a fever.  You are leaking fluid from your vagina.  You have spotting or bleeding from your vagina.  You have severe abdominal pain or cramping.  You have rapid weight loss or weight gain.  You have   shortness of breath with chest pain.  You notice sudden or extreme swelling of your face, hands, ankles, feet, or legs.  Your baby makes fewer than 10 movements in 2 hours.  You have severe headaches that do not go away when you take medicine.  You have vision changes. Summary  The third trimester is from week 28 through week 40, months 7 through 9. The third trimester is a time when the unborn baby (fetus) is growing rapidly.  During the third trimester, your discomfort may increase as you and your baby continue to gain weight. You may have abdominal, leg, and back pain, sleeping problems, and an increased need to urinate.  During the third trimester your breasts will keep growing and they will continue to become tender. A yellow fluid (colostrum) may leak from your breasts. This is the first milk you are producing for your baby.  False labor is a condition in which you feel small, irregular tightenings of the muscles in the womb (contractions) that eventually go away. These are called Braxton Hicks contractions. Contractions may last for hours, days, or even weeks before true labor sets in.  Signs of labor can include: abdominal cramps; regular contractions that start at 10 minutes apart and become stronger and more frequent with time; watery or bloody mucus discharge that comes from the vagina; increased pelvic pressure and dull back pain; and leaking of amniotic fluid. This information is not intended to replace advice given to you by your  health care provider. Make sure you discuss any questions you have with your health care provider. Document Revised: 12/04/2018 Document Reviewed: 09/18/2016 Elsevier Patient Education  2020 Elsevier Inc.   Fetal Movement Counts  What is a fetal movement count?  A fetal movement count is the number of times that you feel your baby move during a certain amount of time. This may also be called a fetal kick count. A fetal movement count is recommended for every pregnant woman. You may be asked to start counting fetal movements as early as week 28 of your pregnancy. Pay attention to when your baby is most active. You may notice your baby's sleep and wake cycles. You may also notice things that make your baby move more. You should do a fetal movement count:  When your baby is normally most active.  At the same time each day. A good time to count movements is while you are resting, after having something to eat and drink. How do I count fetal movements? 1. Find a quiet, comfortable area. Sit, or lie down on your side. 2. Write down the date, the start time and stop time, and the number of movements that you felt between those two times. Take this information with you to your health care visits. 3. Write down your start time when you feel the first movement. 4. Count kicks, flutters, swishes, rolls, and jabs. You should feel at least 10 movements. 5. You may stop counting after you have felt 10 movements, or if you have been counting for 2 hours. Write down the stop time. 6. If you do not feel 10 movements in 2 hours, contact your health care provider for further instructions. Your health care provider may want to do additional tests to assess your baby's well-being. Contact a health care provider if:  You feel fewer than 10 movements in 2 hours.  Your baby is not moving like he or she usually does. Date: ____________ Start time: ____________ Stop time: ____________ Movements:    ____________ Date: ____________ Start time: ____________ Stop time: ____________ Movements: ____________ Date: ____________ Start time: ____________ Stop time: ____________ Movements: ____________ Date: ____________ Start time: ____________ Stop time: ____________ Movements: ____________ Date: ____________ Start time: ____________ Stop time: ____________ Movements: ____________ Date: ____________ Start time: ____________ Stop time: ____________ Movements: ____________ Date: ____________ Start time: ____________ Stop time: ____________ Movements: ____________ Date: ____________ Start time: ____________ Stop time: ____________ Movements: ____________ Date: ____________ Start time: ____________ Stop time: ____________ Movements: ____________ This information is not intended to replace advice given to you by your health care provider. Make sure you discuss any questions you have with your health care provider. Document Revised: 04/02/2019 Document Reviewed: 04/02/2019 Elsevier Patient Education  2020 Elsevier Inc.  

## 2020-06-23 LAB — HIV ANTIBODY (ROUTINE TESTING W REFLEX): HIV Screen 4th Generation wRfx: NONREACTIVE

## 2020-06-23 LAB — RPR: RPR Ser Ql: NONREACTIVE

## 2020-06-23 LAB — CBC
Hematocrit: 32.3 % — ABNORMAL LOW (ref 34.0–46.6)
Hemoglobin: 10.6 g/dL — ABNORMAL LOW (ref 11.1–15.9)
MCH: 29.4 pg (ref 26.6–33.0)
MCHC: 32.8 g/dL (ref 31.5–35.7)
MCV: 90 fL (ref 79–97)
Platelets: 248 10*3/uL (ref 150–450)
RBC: 3.6 x10E6/uL — ABNORMAL LOW (ref 3.77–5.28)
RDW: 13.8 % (ref 11.7–15.4)
WBC: 8.8 10*3/uL (ref 3.4–10.8)

## 2020-06-23 LAB — GLUCOSE TOLERANCE, 2 HOURS W/ 1HR
Glucose, 1 hour: 121 mg/dL (ref 65–179)
Glucose, 2 hour: 77 mg/dL (ref 65–152)
Glucose, Fasting: 81 mg/dL (ref 65–91)

## 2020-06-26 ENCOUNTER — Telehealth: Payer: Self-pay | Admitting: Obstetrics and Gynecology

## 2020-06-26 DIAGNOSIS — O99013 Anemia complicating pregnancy, third trimester: Secondary | ICD-10-CM

## 2020-06-26 MED ORDER — FERROUS SULFATE 325 (65 FE) MG PO TBEC: 325.0000 mg | DELAYED_RELEASE_TABLET | ORAL | 3 refills | Status: DC

## 2020-06-26 NOTE — Telephone Encounter (Signed)
Sent Mychart message regarding normal glucola and negative HIV/RPR. Given mild anemia, sent iron supplement to patient's preferred pharmacy.  Sheila Oats, MD OB Fellow, [facility] 06/26/2020 7:41 AM

## 2020-07-06 ENCOUNTER — Encounter: Payer: Self-pay | Admitting: Nurse Practitioner

## 2020-07-06 ENCOUNTER — Other Ambulatory Visit: Payer: Self-pay

## 2020-07-06 ENCOUNTER — Ambulatory Visit (INDEPENDENT_AMBULATORY_CARE_PROVIDER_SITE_OTHER): Payer: Medicaid Other | Admitting: Nurse Practitioner

## 2020-07-06 VITALS — BP 117/71 | HR 88 | Wt 184.6 lb

## 2020-07-06 DIAGNOSIS — Z3A31 31 weeks gestation of pregnancy: Secondary | ICD-10-CM

## 2020-07-06 DIAGNOSIS — O99340 Other mental disorders complicating pregnancy, unspecified trimester: Secondary | ICD-10-CM

## 2020-07-06 DIAGNOSIS — F32A Depression, unspecified: Secondary | ICD-10-CM

## 2020-07-06 DIAGNOSIS — Z3480 Encounter for supervision of other normal pregnancy, unspecified trimester: Secondary | ICD-10-CM

## 2020-07-06 NOTE — Progress Notes (Signed)
ROB   CC: no concerns today. Does not want tdap Takes pepcid for heartburn, causes migraines

## 2020-07-06 NOTE — Progress Notes (Signed)
    Subjective:  Samantha Carroll is a 24 y.o. [phone] at [redacted]w[redacted]d being seen today for ongoing prenatal care.  She is currently monitored for the following issues for this low-risk pregnancy and has Adjustment disorder with depressed mood; Supervision of other normal pregnancy, antepartum; History of nephrolithiasis; Changes in vision; and [redacted] weeks gestation of pregnancy on their problem list.  Patient reports no complaints.  Contractions: Regular.  .  Movement: Present. Denies leaking of fluid.   The following portions of the patient's history were reviewed and updated as appropriate: allergies, current medications, past family history, past medical history, past social history, past surgical history and problem list. Problem list updated.  Objective:   Vitals:   07/06/20 1103  BP: 117/71  Pulse: 88  Weight: 184 lb 9.6 oz (83.7 kg)    Fetal Status: Fetal Heart Rate (bpm): 124 Fundal Height: 30 cm Movement: Present     General:  Alert, oriented and cooperative. Patient is in no acute distress.  Skin: Skin is warm and dry. No rash noted.   Cardiovascular: Normal heart rate noted  Respiratory: Normal respiratory effort, no problems with respiration noted  Abdomen: Soft, gravid, appropriate for gestational age. Pain/Pressure: Absent     Pelvic:  Cervical exam deferred        Extremities: Normal range of motion.  Edema: None  Mental Status: Normal mood and affect. Normal behavior. Normal judgment and thought content.   Urinalysis:      Assessment and Plan:  Pregnancy: [id number] at [redacted]w[redacted]d  1. Supervision of other normal pregnancy, antepartum States Adoption has been planned and they are waiting on the baby to be born. No questions. States baby is moving well.  2. Depression affecting pregnancy Has seen Sue Lush once and thinks she is doing OK.  Does not need to see her at this time.    Preterm labor symptoms and general obstetric precautions including but not limited to vaginal  bleeding, contractions, leaking of fluid and fetal movement were reviewed in detail with the patient. Please refer to After Visit Summary for other counseling recommendations.  Return in about 2 weeks (around 07/20/2020) for in person ROB.  Nolene Bernheim, RN, MSN, NP-BC Nurse Practitioner, [facility] for [employer], [facility] 07/06/2020 11:33 AM

## 2020-07-16 ENCOUNTER — Encounter (HOSPITAL_COMMUNITY): Payer: Self-pay | Admitting: Obstetrics and Gynecology

## 2020-07-16 ENCOUNTER — Other Ambulatory Visit: Payer: Self-pay

## 2020-07-16 ENCOUNTER — Inpatient Hospital Stay (HOSPITAL_COMMUNITY)
Admission: EM | Admit: 2020-07-16 | Discharge: 2020-07-16 | Disposition: A | Discharge status: Home / Self Care | Payer: Medicaid Other | Attending: Obstetrics and Gynecology | Admitting: Obstetrics and Gynecology

## 2020-07-16 DIAGNOSIS — Z3A33 33 weeks gestation of pregnancy: Secondary | ICD-10-CM | POA: Insufficient documentation

## 2020-07-16 DIAGNOSIS — Z87891 Personal history of nicotine dependence: Secondary | ICD-10-CM | POA: Insufficient documentation

## 2020-07-16 DIAGNOSIS — O219 Vomiting of pregnancy, unspecified: Secondary | ICD-10-CM | POA: Diagnosis not present

## 2020-07-16 LAB — URINALYSIS, ROUTINE W REFLEX MICROSCOPIC
Bilirubin Urine: NEGATIVE
Glucose, UA: NEGATIVE mg/dL
Ketones, ur: NEGATIVE mg/dL
Leukocytes,Ua: NEGATIVE
Nitrite: NEGATIVE
Protein, ur: 100 mg/dL — AB
RBC / HPF: >50 RBC/hpf — ABNORMAL HIGH (ref 0–5)
Specific Gravity, Urine: 1.018 (ref 1.005–1.030)
pH: 8 (ref 5.0–8.0)

## 2020-07-16 MED ORDER — METOCLOPRAMIDE HCL 10 MG PO TABS: 10.0000 mg | ORAL_TABLET | Freq: Four times a day (QID) | ORAL | 0 refills | Status: DC

## 2020-07-16 MED ORDER — ONDANSETRON 4 MG PO TBDP
4.0000 mg | ORAL_TABLET | Freq: Once | ORAL | Status: AC
  Administered 2020-07-16: 4 mg via ORAL
  Filled 2020-07-16: qty 1

## 2020-07-16 MED ORDER — ONDANSETRON 4 MG PO TBDP: 4.0000 mg | ORAL_TABLET | Freq: Four times a day (QID) | ORAL | 0 refills | Status: DC | PRN

## 2020-07-16 MED ORDER — SCOPOLAMINE 1 MG/3DAYS TD PT72: 1.0000 | MEDICATED_PATCH | TRANSDERMAL | 12 refills | Status: DC

## 2020-07-16 MED ORDER — SCOPOLAMINE 1 MG/3DAYS TD PT72
1.0000 | MEDICATED_PATCH | TRANSDERMAL | Status: DC
  Administered 2020-07-16: 1.5 mg via TRANSDERMAL
  Filled 2020-07-16: qty 1

## 2020-07-16 NOTE — ED Triage Notes (Signed)
Emergency Medicine Provider OB Triage Evaluation Note  Samantha Carroll is a 24 y.o. female, [id number], at [redacted]w[redacted]d gestation who presents to the emergency department with complaints of upper abdominal cramping, nausea and vomiting that worsened today.  She is pregnant at 7-1/2 months gestation.  Denies any vaginal bleeding or lower abdominal pain.  Review of  Systems  Positive: Nausea, vomiting and upper abdominal cramping Negative: Vaginal bleeding or pelvic pain  Physical Exam  BP 105/64 (BP Location: Right Arm)   Pulse 98   Temp (!) 97.5 F (36.4 C) (Oral)   Resp 18   Ht 5\' 8"  (1.727 m)   Wt 83 kg   LMP 12/18/2019 (Approximate)   SpO2 100%   BMI 27.83 kg/m  General: Awake, no distress  HEENT: Atraumatic  Resp: Normal effort  Cardiac: Normal rate Abd: Nondistended, nontender  MSK: Moves all extremities without difficulty Neuro: Speech clear  Medical Decision Making  Pt evaluated for pregnancy concern and is stable for transfer to MAU. Pt is in agreement with plan for transfer.  11:54 AM Discussed with MAU APP, 12/20/2019, who accepts patient in transfer.  Clinical Impression  No diagnosis found.     Misty Stanley, PA-C 07/16/20 1213

## 2020-07-16 NOTE — MAU Note (Signed)
Samantha Carroll is a 24 y.o. at [redacted]w[redacted]d here in MAU reporting: nausea and vomiting since yesterday. Has vomited twice and no vomiting since last night but does continue to feel nauseated. States in the morning she has been feeling lightheaded. No pain, bleeding, or LOF. +FM.  Onset of complaint: yesterday  Pain score: 0/10  Vitals:   07/16/20 1121 07/16/20 1226  BP: 105/64 112/61  Pulse: 98 84  Resp: 18 16  Temp: (!) 97.5 F (36.4 C) 97.9 F (36.6 C)  SpO2: 100% 99%     FHT: +FM  Lab orders placed from triage: UA

## 2020-07-16 NOTE — MAU Provider Note (Signed)
Chief Complaint:  Nausea   First Provider Initiated Contact with Patient 07/16/20 1357      HPI: Samantha Carroll is a 24 y.o. [id number] at 73w1dwho presents to maternity admissions reporting n/v x 2 days.  She reports good fetal movement, denies LOF, vaginal bleeding, vaginal itching/burning, urinary symptoms, h/a, dizziness, n/v, or fever/chills.     HPI  Past Medical History: Past Medical History:  Diagnosis Date  . Anxiety   . Breast mass   . Depression    comes and goes, is good right now  . Kidney stones     Past obstetric history: OB History  Gravida Para Term Preterm AB Living  _0 0 1 2  SAB TAB Ectopic Multiple Live Births  0 1 0   2    # Outcome Date GA Lbr Len/2nd Weight Sex Delivery Anes PTL Lv  4 Current           3 TAB 2020          2 Term 04/25/18    F Vag-Spont  N LIV  1 Term 06/18/17    M Vag-Spont  N LIV    Obstetric Comments  2nd preg previa- resolved before labor    Past Surgical History: Past Surgical History:  Procedure Laterality Date  . EYE SURGERY    . INDUCED ABORTION    . REFRACTIVE SURGERY     RT eye  . WISDOM TOOTH EXTRACTION      Family History: Family History  Problem Relation Age of Onset  . Brain cancer Father   . Asthma Father   . Breast cancer Cousin   . Healthy Mother     Social History: Social History   Tobacco Use  . Smoking status: Former Smoker    Packs/day: 1.00    Years: 10.00    Pack years: 10.00    Types: Cigarettes    Quit date: 01/14/2020    Years since quitting: 0.5  . Smokeless tobacco: Never Used  Vaping Use  . Vaping Use: Former  Substance Use Topics  . Alcohol use: Not Currently    Alcohol/week: 7.0 standard drinks    Types: 7 Shots of liquor per week    Comment: wkly  . Drug use: No    Allergies:  Allergies  Allergen Reactions  . Pineapple     Oral itching and swelling    Meds:  Medications Prior to Admission  Medication Sig Dispense Refill Last Dose  . famotidine (PEPCID)  20 MG tablet Take 1 tablet (20 mg total) by mouth 2 (two) times daily. 60 tablet 1 Past Week at Unknown time  . ferrous sulfate 325 (65 FE) MG EC tablet Take 1 tablet (325 mg total) by mouth every other day. 30 tablet 3 07/15/2020 at Unknown time  . Prenat w/o A-FeCbn-Meth-FA-DHA (PRENATE MINI) 29-0.6-0.4-350 MG CAPS Take 1 capsule by mouth daily before breakfast. 90 capsule 3 07/15/2020 at Unknown time  . Blood Pressure Monitoring (BLOOD PRESSURE KIT) DEVI 1 kit by Does not apply route as needed. 1 each 0   . Multiple Vitamins-Minerals (MULTIVITAMIN WITH MINERALS) tablet Take 1 tablet by mouth daily.        ROS:  Review of Systems  Constitutional: Negative for chills, fatigue and fever.  Eyes: Negative for visual disturbance.  Respiratory: Negative for shortness of breath.   Cardiovascular: Negative for chest pain.  Gastrointestinal: Positive for nausea and vomiting. Negative for abdominal pain.  Genitourinary: Negative for difficulty  urinating, dysuria, flank pain, pelvic pain, vaginal bleeding, vaginal discharge and vaginal pain.  Neurological: Negative for dizziness and headaches.  Psychiatric/Behavioral: Negative.      I have reviewed patient's Past Medical Hx, Surgical Hx, Family Hx, Social Hx, medications and allergies.   Physical Exam   Patient Vitals for the past 24 hrs:  BP Temp Temp src Pulse Resp SpO2 Height Weight  07/16/20 1257 105/61 -- -- 83 16 100 % -- --  07/16/20 1226 112/61 97.9 F (36.6 C) Oral 84 16 99 % -- --  07/16/20 1222 -- -- -- -- -- -- _0  (1.727 m) 81.6 kg  07/16/20 1121 105/64 (!) 97.5 F (36.4 C) Oral 98 18 100 % _1  (1.727 m) 83 kg   Constitutional: Well-developed, well-nourished female in no acute distress.  Cardiovascular: normal rate Respiratory: normal effort GI: Abd soft, non-tender, gravid appropriate for gestational age.  MS: Extremities nontender, no edema, normal ROM Neurologic: Alert and oriented x 4.  GU: Neg CVAT.  PELVIC EXAM:  deferred     FHT:  Baseline 130, moderate variability, accelerations present, no decelerations Contractions: none on toco or to palpation    Labs: Results for orders placed or performed during the hospital encounter of 07/16/20 (from the past 24 hour(s))  Urinalysis, Routine w reflex microscopic Urine, Clean Catch     Status: Abnormal   Collection Time: 07/16/20 12:50 PM  Result Value Ref Range   Color, Urine AMBER (A) YELLOW   APPearance HAZY (A) CLEAR   Specific Gravity, Urine 1.018 1.005 - 1.030   pH 8.0 5.0 - 8.0   Glucose, UA NEGATIVE NEGATIVE mg/dL   Hgb urine dipstick MODERATE (A) NEGATIVE   Bilirubin Urine NEGATIVE NEGATIVE   Ketones, ur NEGATIVE NEGATIVE mg/dL   Protein, ur 100 (A) NEGATIVE mg/dL   Nitrite NEGATIVE NEGATIVE   Leukocytes,Ua NEGATIVE NEGATIVE   RBC / HPF >50 (H) 0 - 5 RBC/hpf   WBC, UA 0-5 0 - 5 WBC/hpf   Bacteria, UA RARE (A) NONE SEEN   Squamous Epithelial / LPF 0-5 0 - 5   Mucus PRESENT    Hyaline Casts, UA PRESENT    Non Squamous Epithelial 0-5 (A) NONE SEEN   O/Positive/-- (08/04 0913)  Imaging:  No results found.  MAU Course/MDM: Orders Placed This Encounter  Procedures  . Urinalysis, Routine w reflex microscopic Urine, Clean Catch  . Discharge patient    Meds ordered this encounter  Medications  . ondansetron (ZOFRAN-ODT) disintegrating tablet 4 mg  . scopolamine (TRANSDERM-SCOP) 1 MG/3DAYS 1.5 mg  . scopolamine (TRANSDERM-SCOP) 1 MG/3DAYS    Sig: Place 1 patch (1.5 mg total) onto the skin every 3 (three) days.    Dispense:  10 patch    Refill:  12    Order Specific Question:   Supervising Provider    Answer:   Rip Harbour, MICHAEL L [[id number]]  . ondansetron (ZOFRAN ODT) 4 MG disintegrating tablet    Sig: Take 1 tablet (4 mg total) by mouth every 6 (six) hours as needed for nausea.    Dispense:  20 tablet    Refill:  0    Order Specific Question:   Supervising Provider    Answer:   ERVIN, MICHAEL L [[id number]]  . metoCLOPramide (REGLAN) 10 MG  tablet    Sig: Take 1 tablet (10 mg total) by mouth every 6 (six) hours.    Dispense:  30 tablet    Refill:  0    Order Specific  Question:   Supervising Provider    Answer:   Chancy Milroy [[id number]]     NST reviewed and reactive Discussed use of Zofran with both pt and adoptive mom on the phone Discussed with the patient the risk of Zofran use in the first trimester of pregnancy. Risks of use in the first trimester include birth defects in babies, specifically cleft lip/palate.  Given pt gestational age, risks are likely low. Zofran 4 mg ODT given and scopolamine patch placed in MAU Pt symptoms improved D/C home with Rx for both medications, and add Reglan given poor insurance coverage of scopolamine  Pt discharge with strict return precautions.    Assessment: 1. Nausea and vomiting during pregnancy   2. [redacted] weeks gestation of pregnancy     Plan: Discharge home Labor precautions and fetal kick counts  Allergies as of 07/16/2020      Reactions   Pineapple    Oral itching and swelling      Medication List    TAKE these medications   Blood Pressure Kit Devi 1 kit by Does not apply route as needed.   famotidine 20 MG tablet Commonly known as: Pepcid Take 1 tablet (20 mg total) by mouth 2 (two) times daily.   ferrous sulfate 325 (65 FE) MG EC tablet Take 1 tablet (325 mg total) by mouth every other day.   metoCLOPramide 10 MG tablet Commonly known as: REGLAN Take 1 tablet (10 mg total) by mouth every 6 (six) hours.   multivitamin with minerals tablet Take 1 tablet by mouth daily.   ondansetron 4 MG disintegrating tablet Commonly known as: Zofran ODT Take 1 tablet (4 mg total) by mouth every 6 (six) hours as needed for nausea.   Prenate Mini 29-0.6-0.4-350 MG Caps Take 1 capsule by mouth daily before breakfast.   scopolamine 1 MG/3DAYS Commonly known as: TRANSDERM-SCOP Place 1 patch (1.5 mg total) onto the skin every 3 (three) days. Start taking on: July 19, 2020       Fatima Blank Certified Nurse-Midwife 07/16/2020 3:34 PM

## 2020-07-16 NOTE — ED Notes (Signed)
Report given to MAU charge 

## 2020-07-20 ENCOUNTER — Other Ambulatory Visit: Payer: Self-pay

## 2020-07-20 ENCOUNTER — Ambulatory Visit (INDEPENDENT_AMBULATORY_CARE_PROVIDER_SITE_OTHER): Payer: Medicaid Other | Admitting: Nurse Practitioner

## 2020-07-20 ENCOUNTER — Other Ambulatory Visit (HOSPITAL_COMMUNITY)
Admission: RE | Admit: 2020-07-20 | Discharge: 2020-07-20 | Disposition: A | Discharge status: Home / Self Care | Payer: Medicaid Other | Source: Ambulatory Visit | Attending: Nurse Practitioner | Admitting: Nurse Practitioner

## 2020-07-20 VITALS — BP 118/71 | HR 93 | Wt 182.8 lb

## 2020-07-20 DIAGNOSIS — J00 Acute nasopharyngitis [common cold]: Secondary | ICD-10-CM

## 2020-07-20 DIAGNOSIS — O219 Vomiting of pregnancy, unspecified: Secondary | ICD-10-CM

## 2020-07-20 DIAGNOSIS — Z3480 Encounter for supervision of other normal pregnancy, unspecified trimester: Secondary | ICD-10-CM | POA: Insufficient documentation

## 2020-07-20 DIAGNOSIS — N898 Other specified noninflammatory disorders of vagina: Secondary | ICD-10-CM

## 2020-07-20 DIAGNOSIS — Z3A33 33 weeks gestation of pregnancy: Secondary | ICD-10-CM

## 2020-07-20 MED ORDER — FLUTICASONE PROPIONATE 50 MCG/ACT NA SUSP: 1.0000 | Freq: Every day | NASAL | 1 refills | Status: AC

## 2020-07-20 MED ORDER — CETIRIZINE HCL 10 MG PO TABS: 10.0000 mg | ORAL_TABLET | Freq: Every day | ORAL | 1 refills | Status: AC

## 2020-07-20 NOTE — Progress Notes (Signed)
Pt reports fetal movement with pressure. Pt reports green vaginal discharge.

## 2020-07-20 NOTE — Progress Notes (Signed)
    Subjective:  Samantha Carroll is a 24 y.o. [phone] at [redacted]w[redacted]d being seen today for ongoing prenatal care.  She is currently monitored for the following issues for this low-risk pregnancy and has Adjustment disorder with depressed mood; Supervision of other normal pregnancy, antepartum; History of nephrolithiasis; and Changes in vision on their problem list.  Patient reports vomiting and and congestion with stuffy nose.  Contractions: Not present. Vag. Bleeding: None.  Movement: Present. Denies leaking of fluid.   The following portions of the patient's history were reviewed and updated as appropriate: allergies, current medications, past family history, past medical history, past social history, past surgical history and problem list. Problem list updated.  Objective:   Vitals:   07/20/20 1044  BP: 118/71  Pulse: 93  Weight: 182 lb 12.8 oz (82.9 kg)    Fetal Status: Fetal Heart Rate (bpm): 142 Fundal Height: 35 cm Movement: Present     General:  Alert, oriented and cooperative. Patient is in no acute distress.  Skin: Skin is warm and dry. No rash noted.   Cardiovascular: Normal heart rate noted  Respiratory: Normal respiratory effort, no problems with respiration noted  Abdomen: Soft, gravid, appropriate for gestational age. Pain/Pressure: Present     Pelvic:  Cervical exam deferred       vagina swab done - no discoloration seen on swab, perineum dry  Extremities: Normal range of motion.  Edema: None  Mental Status: Normal mood and affect. Normal behavior. Normal judgment and thought content.   Urinalysis:      Assessment and Plan:  Pregnancy: [id number] at [redacted]w[redacted]d  1. Supervision of other normal pregnancy, antepartum Doing well. Reviewed vaginal swabs will be done at next visit for 36 weeks.  - Cervicovaginal ancillary only( [place])  2. Acute rhinitis Wanted to use Nyquil.  Prescribed Zyrtec and Flonase.  3. Vaginal discharge Started this morning - green  4.  Vomiting affecting pregnancy, antepartum Using current meds - Promethezine which is helping    Preterm labor symptoms and general obstetric precautions including but not limited to vaginal bleeding, contractions, leaking of fluid and fetal movement were reviewed in detail with the patient. Please refer to After Visit Summary for other counseling recommendations.  Return in about 16 days (around 08/05/2020) for in person ROB for vaginal swabs (or week of Dec 13).  Nolene Bernheim, RN, MSN, NP-BC Nurse Practitioner, [facility] for [employer], [facility] 07/20/2020 2:11 PM

## 2020-07-22 LAB — CERVICOVAGINAL ANCILLARY ONLY
Bacterial Vaginitis (gardnerella): NEGATIVE
Candida Glabrata: NEGATIVE
Candida Vaginitis: NEGATIVE
Chlamydia: NEGATIVE
Comment: NEGATIVE
Comment: NEGATIVE
Comment: NEGATIVE
Comment: NEGATIVE
Comment: NEGATIVE
Comment: NORMAL
Neisseria Gonorrhea: NEGATIVE
Trichomonas: NEGATIVE

## 2020-08-02 ENCOUNTER — Encounter (HOSPITAL_COMMUNITY): Payer: Self-pay | Admitting: Obstetrics and Gynecology

## 2020-08-02 ENCOUNTER — Inpatient Hospital Stay (HOSPITAL_COMMUNITY)
Admission: AD | Admit: 2020-08-02 | Discharge: 2020-08-02 | Disposition: A | Discharge status: Home / Self Care | Payer: Medicaid Other | Attending: Obstetrics and Gynecology | Admitting: Obstetrics and Gynecology

## 2020-08-02 ENCOUNTER — Other Ambulatory Visit: Payer: Self-pay

## 2020-08-02 DIAGNOSIS — Z3A35 35 weeks gestation of pregnancy: Secondary | ICD-10-CM | POA: Insufficient documentation

## 2020-08-02 DIAGNOSIS — O99891 Other specified diseases and conditions complicating pregnancy: Secondary | ICD-10-CM

## 2020-08-02 DIAGNOSIS — O212 Late vomiting of pregnancy: Secondary | ICD-10-CM | POA: Diagnosis not present

## 2020-08-02 DIAGNOSIS — O26893 Other specified pregnancy related conditions, third trimester: Secondary | ICD-10-CM | POA: Diagnosis not present

## 2020-08-02 DIAGNOSIS — Z3689 Encounter for other specified antenatal screening: Secondary | ICD-10-CM

## 2020-08-02 DIAGNOSIS — R42 Dizziness and giddiness: Secondary | ICD-10-CM | POA: Insufficient documentation

## 2020-08-02 DIAGNOSIS — O219 Vomiting of pregnancy, unspecified: Secondary | ICD-10-CM

## 2020-08-02 LAB — URINALYSIS, ROUTINE W REFLEX MICROSCOPIC
Bilirubin Urine: NEGATIVE
Glucose, UA: NEGATIVE mg/dL
Hgb urine dipstick: NEGATIVE
Ketones, ur: NEGATIVE mg/dL
Nitrite: NEGATIVE
Protein, ur: NEGATIVE mg/dL
Specific Gravity, Urine: 1.015 (ref 1.005–1.030)
pH: 7 (ref 5.0–8.0)

## 2020-08-02 NOTE — MAU Provider Note (Signed)
History     CSN: [id number]  Arrival date and time: 08/02/20 1033   First Provider Initiated Contact with Patient 08/02/20 1230      Chief Complaint  Patient presents with  . Dizziness   Samantha Carroll is a 24 y.o. [phone] at 73w4dwho presents to MAU for multiple concerns.  Patient reports she has experienced lightheaded for the duration of her pregnancy, that started between 4-8 weeks of pregnancy. Patient reports this does not happen every day. Patient reports this mostly occurs when she is standing at work. Patient describes as lightheadedness and feels dizzy and sometimes has to take breaks. Patient reports she has tried treating this by eating ice and has not tried any other treatments. Patient denies dizziness at this time.  Patient reports she was also concerned this morning because she threw up stomach acid about 1.5 hours after eating food. Patient reports a longstanding history of nausea and vomiting this pregnancy and is currently taking Reglan, which she reports is working for her.  Pt denies VB, LOF, ctx, decreased FM, vaginal discharge/odor/itching. Pt denies N/V, abdominal pain, constipation, diarrhea, or urinary problems. Pt denies fever, chills, fatigue, sweating or changes in appetite. Pt denies SOB or chest pain. Pt denies dizziness, HA, light-headedness, weakness.  Adoptive Mom MJinny Blossompresent for entire visit.   OB History    Gravida  4   Para  2   Term  2   Preterm  0   AB  1   Living  2     SAB  0   TAB  1   Ectopic  0   Multiple      Live Births  2        Obstetric Comments  2nd preg previa- resolved before labor        Past Medical History:  Diagnosis Date  . Anxiety   . Breast mass   . Depression    comes and goes, is good right now  . Kidney stones     Past Surgical History:  Procedure Laterality Date  . EYE SURGERY    . INDUCED ABORTION    . REFRACTIVE SURGERY     RT eye  . WISDOM TOOTH EXTRACTION       Family History  Problem Relation Age of Onset  . Brain cancer Father   . Asthma Father   . Breast cancer Cousin   . Healthy Mother     Social History   Tobacco Use  . Smoking status: Former Smoker    Packs/day: 1.00    Years: 10.00    Pack years: 10.00    Types: Cigarettes    Quit date: 01/14/2020    Years since quitting: 0.5  . Smokeless tobacco: Never Used  Vaping Use  . Vaping Use: Former  Substance Use Topics  . Alcohol use: Not Currently    Alcohol/week: 7.0 standard drinks    Types: 7 Shots of liquor per week    Comment: wkly  . Drug use: No    Allergies:  Allergies  Allergen Reactions  . Pineapple     Oral itching and swelling    Medications Prior to Admission  Medication Sig Dispense Refill Last Dose  . metoCLOPramide (REGLAN) 10 MG tablet Take 1 tablet (10 mg total) by mouth every 6 (six) hours. 30 tablet 0 08/02/2020 at Unknown time  . Blood Pressure Monitoring (BLOOD PRESSURE KIT) DEVI 1 kit by Does not apply route as needed. 1 each  0   . cetirizine (ZYRTEC ALLERGY) 10 MG tablet Take 1 tablet (10 mg total) by mouth daily. 30 tablet 1   . famotidine (PEPCID) 20 MG tablet Take 1 tablet (20 mg total) by mouth 2 (two) times daily. 60 tablet 1   . ferrous sulfate 325 (65 FE) MG EC tablet Take 1 tablet (325 mg total) by mouth every other day. 30 tablet 3   . fluticasone (FLONASE) 50 MCG/ACT nasal spray Place 1 spray into both nostrils daily. 10 mL 1   . Multiple Vitamins-Minerals (MULTIVITAMIN WITH MINERALS) tablet Take 1 tablet by mouth daily.      . ondansetron (ZOFRAN ODT) 4 MG disintegrating tablet Take 1 tablet (4 mg total) by mouth every 6 (six) hours as needed for nausea. (Patient not taking: Reported on 07/20/2020) 20 tablet 0   . Prenat w/o A-FeCbn-Meth-FA-DHA (PRENATE MINI) 29-0.6-0.4-350 MG CAPS Take 1 capsule by mouth daily before breakfast. 90 capsule 3   . scopolamine (TRANSDERM-SCOP) 1 MG/3DAYS Place 1 patch (1.5 mg total) onto the skin every 3  (three) days. (Patient not taking: Reported on 07/20/2020) 10 patch 12     Review of Systems  Constitutional: Negative for chills, diaphoresis, fatigue and fever.  Eyes: Negative for visual disturbance.  Respiratory: Negative for shortness of breath.   Cardiovascular: Negative for chest pain.  Gastrointestinal: Positive for nausea and vomiting. Negative for abdominal pain, constipation and diarrhea.  Genitourinary: Negative for dysuria, flank pain, frequency, pelvic pain, urgency, vaginal bleeding and vaginal discharge.  Neurological: Positive for light-headedness. Negative for dizziness, weakness and headaches.   Physical Exam   Blood pressure 112/66, pulse 83, temperature 98.2 F (36.8 C), resp. rate 19, weight 81 kg, last menstrual period 12/18/2019, SpO2 100 %.  Patient Vitals for the past 24 hrs:  BP Temp Pulse Resp SpO2 Weight  08/02/20 1111 112/66 -- 83 -- -- --  08/02/20 1052 124/74 98.2 F (36.8 C) 86 19 100 % 81 kg   Physical Exam Vitals and nursing note reviewed.  Constitutional:      General: She is not in acute distress.    Appearance: Normal appearance. She is not ill-appearing, toxic-appearing or diaphoretic.  HENT:     Head: Normocephalic and atraumatic.  Pulmonary:     Effort: Pulmonary effort is normal.  Neurological:     Mental Status: She is alert and oriented to person, place, and time.  Psychiatric:        Mood and Affect: Mood normal.        Behavior: Behavior normal.        Thought Content: Thought content normal.        Judgment: Judgment normal.    Results for orders placed or performed during the hospital encounter of 08/02/20 (from the past 24 hour(s))  Urinalysis, Routine w reflex microscopic Urine, Clean Catch     Status: Abnormal   Collection Time: 08/02/20 11:49 AM  Result Value Ref Range   Color, Urine YELLOW YELLOW   APPearance HAZY (A) CLEAR   Specific Gravity, Urine 1.015 1.005 - 1.030   pH 7.0 5.0 - 8.0   Glucose, UA NEGATIVE  NEGATIVE mg/dL   Hgb urine dipstick NEGATIVE NEGATIVE   Bilirubin Urine NEGATIVE NEGATIVE   Ketones, ur NEGATIVE NEGATIVE mg/dL   Protein, ur NEGATIVE NEGATIVE mg/dL   Nitrite NEGATIVE NEGATIVE   Leukocytes,Ua SMALL (A) NEGATIVE   RBC / HPF 0-5 0 - 5 RBC/hpf   WBC, UA 0-5 0 - 5 WBC/hpf  Bacteria, UA RARE (A) NONE SEEN   Squamous Epithelial / LPF 0-5 0 - 5   Mucus PRESENT     MAU Course  Procedures  MDM -long-standing lightheadedness and nausea and vomiting, stable -patient here with questions, no new concerns -EFM: reactive       -baseline: 130       -variability: moderate       -accels: present, 15x15       -decels: absent       -TOCO: few ctx -pt discharged to home in stable condition  Orders Placed This Encounter  Procedures  . Culture, OB Urine    Standing Status:   Standing    Number of Occurrences:   1  . Urinalysis, Routine w reflex microscopic Urine, Clean Catch    Standing Status:   Standing    Number of Occurrences:   1  . Discharge patient    Order Specific Question:   Discharge disposition    Answer:   01-Home or Self Care [1]    Order Specific Question:   Discharge patient date    Answer:   08/02/2020   No orders of the defined types were placed in this encounter.  Assessment and Plan   1. Lightheadedness   2. Nausea and vomiting in pregnancy   3. [redacted] weeks gestation of pregnancy   4. NST (non-stress test) reactive     Allergies as of 08/02/2020      Reactions   Pineapple    Oral itching and swelling      Medication List    TAKE these medications   Blood Pressure Kit Devi 1 kit by Does not apply route as needed.   cetirizine 10 MG tablet Commonly known as: ZyrTEC Allergy Take 1 tablet (10 mg total) by mouth daily.   famotidine 20 MG tablet Commonly known as: Pepcid Take 1 tablet (20 mg total) by mouth 2 (two) times daily.   ferrous sulfate 325 (65 FE) MG EC tablet Take 1 tablet (325 mg total) by mouth every other day.   fluticasone  50 MCG/ACT nasal spray Commonly known as: Flonase Place 1 spray into both nostrils daily.   metoCLOPramide 10 MG tablet Commonly known as: REGLAN Take 1 tablet (10 mg total) by mouth every 6 (six) hours.   multivitamin with minerals tablet Take 1 tablet by mouth daily.   ondansetron 4 MG disintegrating tablet Commonly known as: Zofran ODT Take 1 tablet (4 mg total) by mouth every 6 (six) hours as needed for nausea.   Prenate Mini 29-0.6-0.4-350 MG Caps Take 1 capsule by mouth daily before breakfast.   scopolamine 1 MG/3DAYS Commonly known as: TRANSDERM-SCOP Place 1 patch (1.5 mg total) onto the skin every 3 (three) days.       -will call with culture results, if positive -discussed compression socks/stockings and hydration and frequent rest breaks -discussed normal expectations with nausea and vomiting in pregnancy -return MAU precautions given -pt discharged to home in stable condition  Elmyra Ricks E Akesha Uresti 08/02/2020, 12:57 PM

## 2020-08-02 NOTE — Discharge Instructions (Signed)
Signs and Symptoms of Labor Labor is your body's natural process of moving your baby, placenta, and umbilical cord out of your uterus. The process of labor usually starts when your baby is full-term, between 37 and 40 weeks of pregnancy. How will I know when I am close to going into labor? As your body prepares for labor and the birth of your baby, you may notice the following symptoms in the weeks and days before true labor starts:  Having a strong desire to get your home ready to receive your new baby. This is called nesting. Nesting may be a sign that labor is approaching, and it may occur several weeks before birth. Nesting may involve cleaning and organizing your home.  Passing a small amount of thick, bloody mucus out of your vagina (normal bloody show or losing your mucus plug). This may happen more than a week before labor begins, or it might occur right before labor begins as the opening of the cervix starts to widen (dilate). For some women, the entire mucus plug passes at once. For others, smaller portions of the mucus plug may gradually pass over several days.  Your baby moving (dropping) lower in your pelvis to get into position for birth (lightening). When this happens, you may feel more pressure on your bladder and pelvic bone and less pressure on your ribs. This may make it easier to breathe. It may also cause you to need to urinate more often and have problems with bowel movements.  Having "practice contractions" (Braxton Hicks contractions) that occur at irregular (unevenly spaced) intervals that are more than 10 minutes apart. This is also called false labor. False labor contractions are common after exercise or sexual activity, and they will stop if you change position, rest, or drink fluids. These contractions are usually mild and do not get stronger over time. They may feel like: ? A backache or back pain. ? Mild cramps, similar to menstrual cramps. ? Tightening or pressure in  your abdomen. Other early symptoms that labor may be starting soon include:  Nausea or loss of appetite.  Diarrhea.  Having a sudden burst of energy, or feeling very tired.  Mood changes.  Having trouble sleeping. How will I know when labor has begun? Signs that true labor has begun may include:  Having contractions that come at regular (evenly spaced) intervals and increase in intensity. This may feel like more intense tightening or pressure in your abdomen that moves to your back. ? Contractions may also feel like rhythmic pain in your upper thighs or back that comes and goes at regular intervals. ? For first-time mothers, this change in intensity of contractions often occurs at a more gradual pace. ? Women who have given birth before may notice a more rapid progression of contraction changes.  Having a feeling of pressure in the vaginal area.  Your water breaking (rupture of membranes). This is when the sac of fluid that surrounds your baby breaks. When this happens, you will notice fluid leaking from your vagina. This may be clear or blood-tinged. Labor usually starts within 24 hours of your water breaking, but it may take longer to begin. ? Some women notice this as a gush of fluid. ? Others notice that their underwear repeatedly becomes damp. Follow these instructions at home:   When labor starts, or if your water breaks, call your health care provider or nurse care line. Based on your situation, they will determine when you should go in for an   exam.  When you are in early labor, you may be able to rest and manage symptoms at home. Some strategies to try at home include: ? Breathing and relaxation techniques. ? Taking a warm bath or shower. ? Listening to music. ? Using a heating pad on the lower back for pain. If you are directed to use heat:  Place a towel between your skin and the heat source.  Leave the heat on for 20-30 minutes.  Remove the heat if your skin turns  bright red. This is especially important if you are unable to feel pain, heat, or cold. You may have a greater risk of getting burned. Get help right away if:  You have painful, regular contractions that are 5 minutes apart or less.  Labor starts before you are [redacted] weeks along in your pregnancy.  You have a fever.  You have a headache that does not go away.  You have bright red blood coming from your vagina.  You do not feel your baby moving.  You have a sudden onset of: ? Severe headache with vision problems. ? Nausea, vomiting, or diarrhea. ? Chest pain or shortness of breath. These symptoms may be an emergency. If your health care provider recommends that you go to the hospital or birth center where you plan to deliver, do not drive yourself. Have someone else drive you, or call emergency services (911 in the [place] Summary  Labor is your body's natural process of moving your baby, placenta, and umbilical cord out of your uterus.  The process of labor usually starts when your baby is full-term, between 74 and 40 weeks of pregnancy.  When labor starts, or if your water breaks, call your health care provider or nurse care line. Based on your situation, they will determine when you should go in for an exam. This information is not intended to replace advice given to you by your health care provider. Make sure you discuss any questions you have with your health care provider. Document Revised: 05/13/2017 Document Reviewed: 01/18/2017 Elsevier Patient Education  2020 Else[employer] Inc[employer]        [employer] of the uterus can occur throughout pregnancy, but they are not always a sign that you are in labor. You may have practice contractions called Braxton Hicks contractions. These false labor contractions are sometimes confused with true labor. What are Deberah Pelton contractions? Braxton Hicks contractions are tightening movements that occur in the muscles of  the uterus before labor. Unlike true labor contractions, these contractions do not result in opening (dilation) and thinning of the cervix. Toward the end of pregnancy (32-34 weeks), Braxton Hicks contractions can happen more often and may become stronger. These contractions are sometimes difficult to tell apart from true labor because they can be very uncomfortable. You should not feel embarrassed if you go to the hospital with false labor. Sometimes, the only way to tell if you are in true labor is for your health care provider to look for changes in the cervix. The health care provider will do a physical exam and may monitor your contractions. If you are not in true labor, the exam should show that your cervix is not dilating and your water has not broken. If there are no other health problems associated with your pregnancy, it is completely safe for you to be sent home with false labor. You may continue to have Braxton Hicks contractions until you go into true labor. How to tell the difference  between true labor and false labor True labor  Contractions last 30-70 seconds.  Contractions become very regular.  Discomfort is usually felt in the top of the uterus, and it spreads to the lower abdomen and low back.  Contractions do not go away with walking.  Contractions usually become more intense and increase in frequency.  The cervix dilates and gets thinner. False labor  Contractions are usually shorter and not as strong as true labor contractions.  Contractions are usually irregular.  Contractions are often felt in the front of the lower abdomen and in the groin.  Contractions may go away when you walk around or change positions while lying down.  Contractions get weaker and are shorter-lasting as time goes on.  The cervix usually does not dilate or become thin. Follow these instructions at home:   Take over-the-counter and prescription medicines only as told by your health care  provider.  Keep up with your usual exercises and follow other instructions from your health care provider.  Eat and drink lightly if you think you are going into labor.  If Braxton Hicks contractions are making you uncomfortable: ? Change your position from lying down or resting to walking, or change from walking to resting. ? Sit and rest in a tub of warm water. ? Drink enough fluid to keep your urine pale yellow. Dehydration may cause these contractions. ? Do slow and deep breathing several times an hour.  Keep all follow-up prenatal visits as told by your health care provider. This is important. Contact a health care provider if:  You have a fever.  You have continuous pain in your abdomen. Get help right away if:  Your contractions become stronger, more regular, and closer together.  You have fluid leaking or gushing from your vagina.  You pass blood-tinged mucus (bloody show).  You have bleeding from your vagina.  You have low back pain that you never had before.  You feel your babys head pushing down and causing pelvic pressure.  Your baby is not moving inside you as much as it used to. Summary  Contractions that occur before labor are called Braxton Hicks contractions, false labor, or practice contractions.  Braxton Hicks contractions are usually shorter, weaker, farther apart, and less regular than true labor contractions. True labor contractions usually become progressively stronger and regular, and they become more frequent.  Manage discomfort from [facility] contractions by changing position, resting in a warm bath, drinking plenty of water, or practicing deep breathing. This information is not intended to replace advice given to you by your health care provider. Make sure you discuss any questions you have with your health care provider. Document Revised: 07/26/2017 Document Reviewed: 12/27/2016 Elsevier Patient Education  2020 Elsevier  Inc.         Abdominal Pain During Pregnancy  Abdominal pain is common during pregnancy, and has many possible causes. Some causes are more serious than others, and sometimes the cause is not known. Abdominal pain can be a sign that labor is starting. It can also be caused by normal growth and stretching of muscles and ligaments during pregnancy. Always tell your health care provider if you have any abdominal pain. Follow these instructions at home:  Do not have sex or put anything in your vagina until your pain goes away completely.  Get plenty of rest until your pain improves.  Drink enough fluid to keep your urine pale yellow.  Take over-the-counter and prescription medicines only as told by your  health care provider.  Keep all follow-up visits as told by your health care provider. This is important. Contact a health care provider if:  Your pain continues or gets worse after resting.  You have lower abdominal pain that: ? Comes and goes at regular intervals. ? Spreads to your back. ? Is similar to menstrual cramps.  You have pain or burning when you urinate. Get help right away if:  You have a fever or chills.  You have vaginal bleeding.  You are leaking fluid from your vagina.  You are passing tissue from your vagina.  You have vomiting or diarrhea that lasts for more than 24 hours.  Your baby is moving less than usual.  You feel very weak or faint.  You have shortness of breath.  You develop severe pain in your upper abdomen. Summary  Abdominal pain is common during pregnancy, and has many possible causes.  If you experience abdominal pain during pregnancy, tell your health care provider right away.  Follow your health care provider's home care instructions and keep all follow-up visits as directed. This information is not intended to replace advice given to you by your health care provider. Make sure you discuss any questions you have with your health  care provider. Document Revised: 12/01/2018 Document Reviewed: 11/15/2016 Elsevier Patient Education  2020 Elsevier Inc.       Dizziness Dizziness is a common problem. It is a feeling of unsteadiness or light-headedness. You may feel like you are about to faint. Dizziness can lead to injury if you stumble or fall. Anyone can become dizzy, but dizziness is more common in older adults. This condition can be caused by a number of things, including medicines, dehydration, or illness. Follow these instructions at home: Eating and drinking  Drink enough fluid to keep your urine clear or pale yellow. This helps to keep you from becoming dehydrated. Try to drink more clear fluids, such as water.  Do not drink alcohol.  Limit your caffeine intake if told to do so by your health care provider. Check ingredients and nutrition facts to see if a food or beverage contains caffeine.  Limit your salt (sodium) intake if told to do so by your health care provider. Check ingredients and nutrition facts to see if a food or beverage contains sodium. Activity  Avoid making quick movements. ? Rise slowly from chairs and steady yourself until you feel okay. ? In the morning, first sit up on the side of the bed. When you feel okay, stand slowly while you hold onto something until you know that your balance is fine.  If you need to stand in one place for a long time, move your legs often. Tighten and relax the muscles in your legs while you are standing.  Do not drive or use heavy machinery if you feel dizzy.  Avoid bending down if you feel dizzy. Place items in your home so that they are easy for you to reach without leaning over. Lifestyle  Do not use any products that contain nicotine or tobacco, such as cigarettes and e-cigarettes. If you need help quitting, ask your health care provider.  Try to reduce your stress level by using methods such as yoga or meditation. Talk with your health care provider  if you need help to manage your stress. General instructions  Watch your dizziness for any changes.  Take over-the-counter and prescription medicines only as told by your health care provider. Talk with your health care provider if  you think that your dizziness is caused by a medicine that you are taking.  Tell a friend or a family member that you are feeling dizzy. If he or she notices any changes in your behavior, have this person call your health care provider.  Keep all follow-up visits as told by your health care provider. This is important. Contact a health care provider if:  Your dizziness does not go away.  Your dizziness or light-headedness gets worse.  You feel nauseous.  You have reduced hearing.  You have new symptoms.  You are unsteady on your feet or you feel like the room is spinning. Get help right away if:  You vomit or have diarrhea and are unable to eat or drink anything.  You have problems talking, walking, swallowing, or using your arms, hands, or legs.  You feel generally weak.  You are not thinking clearly or you have trouble forming sentences. It may take a friend or family member to notice this.  You have chest pain, abdominal pain, shortness of breath, or sweating.  Your vision changes.  You have any bleeding.  You have a severe headache.  You have neck pain or a stiff neck.  You have a fever. These symptoms may represent a serious problem that is an emergency. Do not wait to see if the symptoms will go away. Get medical help right away. Call your local emergency services (911 in the [place] Do not drive yourself to the hospital. Summary  Dizziness is a feeling of unsteadiness or light-headedness. This condition can be caused by a number of things, including medicines, dehydration, or illness.  Anyone can become dizzy, but dizziness is more common in older adults.  Drink enough fluid to keep your urine clear or pale yellow. Do not drink  alcohol.  Avoid making quick movements if you feel dizzy. Monitor your dizziness for any changes. This information is not intended to replace advice given to you by your health care provider. Make sure you discuss any questions you have with your health care provider. Document Revised: 08/16/2017 Document Reviewed: 09/15/2016 Elsevier Patient Education  2020 [employer].                        Safe Medications in Pregnancy    Acne: Benzoyl Peroxide Salicylic Acid  Backache/Headache: Tylenol: 2 regular strength every 4 hours OR              2 Extra strength every 6 hours  Colds/Coughs/Allergies: Benadryl (alcohol free) 25 mg every 6 hours as needed Breath right strips Claritin Cepacol throat lozenges Chloraseptic throat spray Cold-Eeze- up to three times per day Cough drops, alcohol free Flonase (by prescription only) Guaifenesin Mucinex Robitussin DM (plain only, alcohol free) Saline nasal spray/drops Sudafed (pseudoephedrine) & Actifed ** use only after [redacted] weeks gestation and if you do not have high blood pressure Tylenol Vicks Vaporub Zinc lozenges Zyrtec   Constipation: Colace Ducolax suppositories Fleet enema Glycerin suppositories Metamucil Milk of magnesia Miralax Senokot Smooth move tea  Diarrhea: Kaopectate Imodium A-D  *NO pepto Bismol  Hemorrhoids: Anusol Anusol HC Preparation H Tucks  Indigestion: Tums Maalox Mylanta Zantac  Pepcid  Insomnia: Benadryl (alcohol free)  every 6 hours as needed Tylenol PM Unisom, no Gelcaps  Leg Cramps: Tums MagGel  Nausea/Vomiting:  Bonine Dramamine Emetrol Ginger extract Sea bands Meclizine  Nausea medication to take during pregnancy:  Unisom (doxylamine succinate 25 mg tablets) Take one tablet daily at bedtime.  If symptoms are not adequately controlled, the dose can be increased to a maximum recommended dose of two tablets daily (1/2 tablet in the morning, 1/2 tablet  mid-afternoon and one at bedtime). Vitamin B6  tablets. Take one tablet twice a day (up to 200 mg per day).  Skin Rashes: Aveeno products Benadryl cream or  every 6 hours as needed Calamine Lotion 1% cortisone cream  Yeast infection: Gyne-lotrimin 7 Monistat 7   **If taking multiple medications, please check labels to avoid duplicating the same active ingredients **take medication as directed on the label ** Do not exceed 4000 mg of tylenol in 24 hours **Do not take medications that contain aspirin or ibuprofen           Third Trimester of Pregnancy The third trimester is from week 28 through week 40 (months 7 through 9). The third trimester is a time when the unborn baby (fetus) is growing rapidly. At the end of the ninth month, the fetus is about 20 inches in length and weighs 6-10 pounds. Body changes during your third trimester Your body will continue to go through many changes during pregnancy. The changes vary from woman to woman. During the third trimester:  Your weight will continue to increase. You can expect to gain 25-35 pounds (11-16 kg) by the end of the pregnancy.  You may begin to get stretch marks on your hips, abdomen, and breasts.  You may urinate more often because the fetus is moving lower into your pelvis and pressing on your bladder.  You may develop or continue to have heartburn. This is caused by increased hormones that slow down muscles in the digestive tract.  You may develop or continue to have constipation because increased hormones slow digestion and cause the muscles that push waste through your intestines to relax.  You may develop hemorrhoids. These are swollen veins (varicose veins) in the rectum that can itch or be painful.  You may develop swollen, bulging veins (varicose veins) in your legs.  You may have increased body aches in the pelvis, back, or thighs. This is due to weight gain and increased hormones that are relaxing  your joints.  You may have changes in your hair. These can include thickening of your hair, rapid growth, and changes in texture. Some women also have hair loss during or after pregnancy, or hair that feels dry or thin. Your hair will most likely return to normal after your baby is born.  Your breasts will continue to grow and they will continue to become tender. A yellow fluid (colostrum) may leak from your breasts. This is the first milk you are producing for your baby.  Your belly button may stick out.  You may notice more swelling in your hands, face, or ankles.  You may have increased tingling or numbness in your hands, arms, and legs. The skin on your belly may also feel numb.  You may feel short of breath because of your expanding uterus.  You may have more problems sleeping. This can be caused by the size of your belly, increased need to urinate, and an increase in your body's metabolism.  You may notice the fetus "dropping," or moving lower in your abdomen (lightening).  You may have increased vaginal discharge.  You may notice your joints feel loose and you may have pain around your pelvic bone. What to expect at prenatal visits You will have prenatal exams every 2 weeks until week 36. Then you will have weekly prenatal  exams. During a routine prenatal visit:  You will be weighed to make sure you and the baby are growing normally.  Your blood pressure will be taken.  Your abdomen will be measured to track your baby's growth.  The fetal heartbeat will be listened to.  Any test results from the previous visit will be discussed.  You may have a cervical check near your due date to see if your cervix has softened or thinned (effaced).  You will be tested for Group B streptococcus. This happens between 35 and 37 weeks. Your health care provider may ask you:  What your birth plan is.  How you are feeling.  If you are feeling the baby move.  If you have had any  abnormal symptoms, such as leaking fluid, bleeding, severe headaches, or abdominal cramping.  If you are using any tobacco products, including cigarettes, chewing tobacco, and electronic cigarettes.  If you have any questions. Other tests or screenings that may be performed during your third trimester include:  Blood tests that check for low iron levels (anemia).  Fetal testing to check the health, activity level, and growth of the fetus. Testing is done if you have certain medical conditions or if there are problems during the pregnancy.  Nonstress test (NST). This test checks the health of your baby to make sure there are no signs of problems, such as the baby not getting enough oxygen. During this test, a belt is placed around your belly. The baby is made to move, and its heart rate is monitored during movement. What is false labor? False labor is a condition in which you feel small, irregular tightenings of the muscles in the womb (contractions) that usually go away with rest, changing position, or drinking water. These are called Braxton Hicks contractions. Contractions may last for hours, days, or even weeks before true labor sets in. If contractions come at regular intervals, become more frequent, increase in intensity, or become painful, you should see your health care provider. What are the signs of labor?  Abdominal cramps.  Regular contractions that start at 10 minutes apart and become stronger and more frequent with time.  Contractions that start on the top of the uterus and spread down to the lower abdomen and back.  Increased pelvic pressure and dull back pain.  A watery or bloody mucus discharge that comes from the vagina.  Leaking of amniotic fluid. This is also known as your "water breaking." It could be a slow trickle or a gush. Let your health care provider know if it has a color or strange odor. If you have any of these signs, call your health care provider right away,  even if it is before your due date. Follow these instructions at home: Medicines  Follow your health care provider's instructions regarding medicine use. Specific medicines may be either safe or unsafe to take during pregnancy.  Take a prenatal vitamin that contains at least 600 micrograms (mcg) of folic acid.  If you develop constipation, try taking a stool softener if your health care provider approves. Eating and drinking   Eat a balanced diet that includes fresh fruits and vegetables, whole grains, good sources of protein such as meat, eggs, or tofu, and low-fat dairy. Your health care provider will help you determine the amount of weight gain that is right for you.  Avoid raw meat and uncooked cheese. These carry germs that can cause birth defects in the baby.  If you have low calcium intake  from food, talk to your health care provider about whether you should take a daily calcium supplement.  Eat four or five small meals rather than three large meals a day.  Limit foods that are high in fat and processed sugars, such as fried and sweet foods.  To prevent constipation: ? Drink enough fluid to keep your urine clear or pale yellow. ? Eat foods that are high in fiber, such as fresh fruits and vegetables, whole grains, and beans. Activity  Exercise only as directed by your health care provider. Most women can continue their usual exercise routine during pregnancy. Try to exercise for 30 minutes at least 5 days a week. Stop exercising if you experience uterine contractions.  Avoid heavy lifting.  Do not exercise in extreme heat or humidity, or at high altitudes.  Wear low-heel, comfortable shoes.  Practice good posture.  You may continue to have sex unless your health care provider tells you otherwise. Relieving pain and discomfort  Take frequent breaks and rest with your legs elevated if you have leg cramps or low back pain.  Take warm sitz baths to soothe any pain or  discomfort caused by hemorrhoids. Use hemorrhoid cream if your health care provider approves.  Wear a good support bra to prevent discomfort from breast tenderness.  If you develop varicose veins: ? Wear support pantyhose or compression stockings as told by your healthcare provider. ? Elevate your feet for 15 minutes, 3-4 times a day. Prenatal care  Write down your questions. Take them to your prenatal visits.  Keep all your prenatal visits as told by your health care provider. This is important. Safety  Wear your seat belt at all times when driving.  Make a list of emergency phone numbers, including numbers for family, friends, the hospital, and police and fire departments. General instructions  Avoid cat litter boxes and soil used by cats. These carry germs that can cause birth defects in the baby. If you have a cat, ask someone to clean the litter box for you.  Do not travel far distances unless it is absolutely necessary and only with the approval of your health care provider.  Do not use hot tubs, steam rooms, or saunas.  Do not drink alcohol.  Do not use any products that contain nicotine or tobacco, such as cigarettes and e-cigarettes. If you need help quitting, ask your health care provider.  Do not use any medicinal herbs or unprescribed drugs. These chemicals affect the formation and growth of the baby.  Do not douche or use tampons or scented sanitary pads.  Do not cross your legs for long periods of time.  To prepare for the arrival of your baby: ? Take prenatal classes to understand, practice, and ask questions about labor and delivery. ? Make a trial run to the hospital. ? Visit the hospital and tour the maternity area. ? Arrange for maternity or paternity leave through employers. ? Arrange for family and friends to take care of pets while you are in the hospital. ? Purchase a rear-facing car seat and make sure you know how to install it in your car. ? Pack  your hospital bag. ? Prepare the babys nursery. Make sure to remove all pillows and stuffed animals from the baby's crib to prevent suffocation.  Visit your dentist if you have not gone during your pregnancy. Use a soft toothbrush to brush your teeth and be gentle when you floss. Contact a health care provider if:  You are unsure  if you are in labor or if your water has broken.  You become dizzy.  You have mild pelvic cramps, pelvic pressure, or nagging pain in your abdominal area.  You have lower back pain.  You have persistent nausea, vomiting, or diarrhea.  You have an unusual or bad smelling vaginal discharge.  You have pain when you urinate. Get help right away if:  Your water breaks before 37 weeks.  You have regular contractions less than 5 minutes apart before 37 weeks.  You have a fever.  You are leaking fluid from your vagina.  You have spotting or bleeding from your vagina.  You have severe abdominal pain or cramping.  You have rapid weight loss or weight gain.  You have shortness of breath with chest pain.  You notice sudden or extreme swelling of your face, hands, ankles, feet, or legs.  Your baby makes fewer than 10 movements in 2 hours.  You have severe headaches that do not go away when you take medicine.  You have vision changes. Summary  The third trimester is from week 28 through week 40, months 7 through 9. The third trimester is a time when the unborn baby (fetus) is growing rapidly.  During the third trimester, your discomfort may increase as you and your baby continue to gain weight. You may have abdominal, leg, and back pain, sleeping problems, and an increased need to urinate.  During the third trimester your breasts will keep growing and they will continue to become tender. A yellow fluid (colostrum) may leak from your breasts. This is the first milk you are producing for your baby.  False labor is a condition in which you feel small,  irregular tightenings of the muscles in the womb (contractions) that eventually go away. These are called Braxton Hicks contractions. Contractions may last for hours, days, or even weeks before true labor sets in.  Signs of labor can include: abdominal cramps; regular contractions that start at 10 minutes apart and become stronger and more frequent with time; watery or bloody mucus discharge that comes from the vagina; increased pelvic pressure and dull back pain; and leaking of amniotic fluid. This information is not intended to replace advice given to you by your health care provider. Make sure you discuss any questions you have with your health care provider. Document Revised: 12/04/2018 Document Reviewed: 09/18/2016 Elsevier Patient Education  2020 [employer]        Fetal Movement Counts Patient Name: ________________________________________________ Patient Due Date: ____________________ What is a fetal movement count?  A fetal movement count is the number of times that you feel your baby move during a certain amount of time. This may also be called a fetal kick count. A fetal movement count is recommended for every pregnant woman. You may be asked to start counting fetal movements as early as week 28 of your pregnancy. Pay attention to when your baby is most active. You may notice your baby's sleep and wake cycles. You may also notice things that make your baby move more. You should do a fetal movement count:  When your baby is normally most active.  At the same time each day. A good time to count movements is while you are resting, after having something to eat and drink. How do I count fetal movements? 1. Find a quiet, comfortable area. Sit, or lie down on your side. 2. Write down the date, the start time and stop time, and the number of movements that you  felt between those two times. Take this information with you to your health care visits. 3. Write down your start time when  you feel the first movement. 4. Count kicks, flutters, swishes, rolls, and jabs. You should feel at least 10 movements. 5. You may stop counting after you have felt 10 movements, or if you have been counting for 2 hours. Write down the stop time. 6. If you do not feel 10 movements in 2 hours, contact your health care provider for further instructions. Your health care provider may want to do additional tests to assess your baby's well-being. Contact a health care provider if:  You feel fewer than 10 movements in 2 hours.  Your baby is not moving like he or she usually does. Date: ____________ Start time: ____________ Stop time: ____________ Movements: ____________ Date: ____________ Start time: ____________ Stop time: ____________ Movements: ____________ Date: ____________ Start time: ____________ Stop time: ____________ Movements: ____________ Date: ____________ Start time: ____________ Stop time: ____________ Movements: ____________ Date: ____________ Start time: ____________ Stop time: ____________ Movements: ____________ Date: ____________ Start time: ____________ Stop time: ____________ Movements: ____________ Date: ____________ Start time: ____________ Stop time: ____________ Movements: ____________ Date: ____________ Start time: ____________ Stop time: ____________ Movements: ____________ Date: ____________ Start time: ____________ Stop time: ____________ Movements: ____________ This information is not intended to replace advice given to you by your health care provider. Make sure you discuss any questions you have with your health care provider. Document Revised: 04/02/2019 Document Reviewed: 04/02/2019 Elsevier Patient Education  2020 [employer].

## 2020-08-02 NOTE — MAU Note (Signed)
Patient reports intermittent lightheadedness and that she vomited stomach acid this morning even though she ate, which concerned her.  States the emesis episode occurred 1.5 hours after eating.  Also reports she lost her mucous plug on Tuesday.  Patient states her N/V is ongoing and she is on meds for it.  Endorses + FM.  Denies VB.  Reports CTX every 90 mins.  Denies LOF.

## 2020-08-03 LAB — CULTURE, OB URINE

## 2020-08-05 ENCOUNTER — Other Ambulatory Visit: Payer: Self-pay

## 2020-08-05 ENCOUNTER — Ambulatory Visit (INDEPENDENT_AMBULATORY_CARE_PROVIDER_SITE_OTHER): Payer: Medicaid Other | Admitting: Advanced Practice Midwife

## 2020-08-05 ENCOUNTER — Other Ambulatory Visit (HOSPITAL_COMMUNITY)
Admission: RE | Admit: 2020-08-05 | Discharge: 2020-08-05 | Disposition: A | Discharge status: Home / Self Care | Payer: Medicaid Other | Source: Ambulatory Visit | Attending: Advanced Practice Midwife | Admitting: Advanced Practice Midwife

## 2020-08-05 VITALS — BP 123/75 | HR 89 | Wt 178.4 lb

## 2020-08-05 DIAGNOSIS — Z3480 Encounter for supervision of other normal pregnancy, unspecified trimester: Secondary | ICD-10-CM | POA: Diagnosis not present

## 2020-08-05 DIAGNOSIS — Z3A36 36 weeks gestation of pregnancy: Secondary | ICD-10-CM | POA: Diagnosis not present

## 2020-08-05 NOTE — Patient Instructions (Signed)

## 2020-08-05 NOTE — Progress Notes (Signed)
   PRENATAL VISIT NOTE  Subjective:  Samantha Carroll is a 24 y.o. [phone] at [redacted]w[redacted]d being seen today for ongoing prenatal care.  She is currently monitored for the following issues for this low-risk pregnancy and has Adjustment disorder with depressed mood; Supervision of other normal pregnancy, antepartum; History of nephrolithiasis; and Changes in vision on their problem list.  Patient reports no bleeding, no cramping, no leaking and occasional contractions.  Contractions: Irritability. Vag. Bleeding: None.  Movement: Present. Denies leaking of fluid.   The following portions of the patient's history were reviewed and updated as appropriate: allergies, current medications, past family history, past medical history, past social history, past surgical history and problem list.   Objective:   Vitals:   08/05/20 0919  BP: 123/75  Pulse: 89  Weight: 80.9 kg    Fetal Status: Fetal Heart Rate (bpm): 155 Fundal Height: 36 cm Movement: Present     General:  Alert, oriented and cooperative. Patient is in no acute distress.  Skin: Skin is warm and dry. No rash noted.   Cardiovascular: Normal heart rate noted  Respiratory: Normal respiratory effort, no problems with respiration noted  Abdomen: Soft, gravid, appropriate for gestational age.  Pain/Pressure: Present     Pelvic: Cervical exam performed in the presence of a chaperone        Extremities: Normal range of motion.  Edema: None  Mental Status: Normal mood and affect. Normal behavior. Normal judgment and thought content.   Assessment and Plan:  Pregnancy: G4P2012 at [redacted]w[redacted]d 1. [redacted] weeks gestation of pregnancy - Strep Gp B NAA - GC/Chlamydia probe amp ([place])not at [facility] -SVE performed FT  2. Supervision of other normal pregnancy, antepartum   Preterm labor symptoms and general obstetric precautions including but not limited to vaginal bleeding, contractions, leaking of fluid and fetal movement were reviewed in detail with the  patient. Please refer to After Visit Summary for other counseling recommendations.   Return in about 1 week (around 08/12/2020).  Future Appointments  Date Time Provider Department Center  08/10/2020 10:15 AM Brock Bad, MD [facility] None    Sande Rives, Student-MidWife   Attestation of Supervision of Student:  I confirm that I have verified the information documented in the nurse midwife student's note and that I have also personally reperformed the history, physical exam and all medical decision making activities.  I have verified that all services and findings are accurately documented in this student's note; and I agree with management and plan as outlined in the documentation. I have also made any necessary editorial changes.  --S/p Video Visit with Samantha Saxon, LCSW on 05/30/2020 --Encouraged patient to schedule follow-up with Samantha Carroll, secure chat sent to [facility] --Preemptive teaching for GBS + result, justification for treatment in labor --Confirmed patient is aware of MAU location for labor/SROM triage, signs of worsening acuity  Calvert Cantor, CNM [facility] [employer], [facility] 08/05/2020 11:44 AM

## 2020-08-07 ENCOUNTER — Encounter: Payer: Self-pay | Admitting: Advanced Practice Midwife

## 2020-08-07 DIAGNOSIS — B951 Streptococcus, group B, as the cause of diseases classified elsewhere: Secondary | ICD-10-CM | POA: Insufficient documentation

## 2020-08-07 LAB — STREP GP B NAA: Strep Gp B NAA: POSITIVE — AB

## 2020-08-08 LAB — GC/CHLAMYDIA PROBE AMP (~~LOC~~) NOT AT ARMC
Chlamydia: NEGATIVE
Comment: NEGATIVE
Comment: NORMAL
Neisseria Gonorrhea: NEGATIVE

## 2020-08-09 ENCOUNTER — Encounter: Payer: Medicaid Other | Admitting: Licensed Clinical Social Worker

## 2020-08-09 ENCOUNTER — Telehealth: Payer: Self-pay | Admitting: Licensed Clinical Social Worker

## 2020-08-09 NOTE — Telephone Encounter (Signed)
Called pt regarding scheduled mychart visit. Left message for callback  

## 2020-08-10 ENCOUNTER — Ambulatory Visit (INDEPENDENT_AMBULATORY_CARE_PROVIDER_SITE_OTHER): Payer: Medicaid Other | Admitting: Obstetrics

## 2020-08-10 ENCOUNTER — Other Ambulatory Visit: Payer: Self-pay

## 2020-08-10 ENCOUNTER — Encounter: Payer: Self-pay | Admitting: Obstetrics

## 2020-08-10 VITALS — BP 115/76 | HR 97 | Wt 179.0 lb

## 2020-08-10 DIAGNOSIS — Z3480 Encounter for supervision of other normal pregnancy, unspecified trimester: Secondary | ICD-10-CM

## 2020-08-10 NOTE — Progress Notes (Signed)
Patient presents for ROB. Patient is concerned about PCN treatment for GBS status. She states that her mother is highly allergic to PCN, and she is concerned that she may develop a reaction to this. Patient has no other concerns today.

## 2020-08-10 NOTE — Progress Notes (Signed)
Subjective:  Samantha Carroll is a 24 y.o. [phone] at [redacted]w[redacted]d being seen today for ongoing prenatal care.  She is currently monitored for the following issues for this low-risk pregnancy and has Adjustment disorder with depressed mood; Supervision of other normal pregnancy, antepartum; History of nephrolithiasis; Changes in vision; and Positive GBS test on their problem list.  Patient reports no complaints.  Contractions: Irritability. Vag. Bleeding: None.  Movement: Present. Denies leaking of fluid.   The following portions of the patient's history were reviewed and updated as appropriate: allergies, current medications, past family history, past medical history, past social history, past surgical history and problem list. Problem list updated.  Objective:   Vitals:   08/10/20 1009  BP: 115/76  Pulse: 97  Weight: 179 lb (81.2 kg)    Fetal Status:     Movement: Present     General:  Alert, oriented and cooperative. Patient is in no acute distress.  Skin: Skin is warm and dry. No rash noted.   Cardiovascular: Normal heart rate noted  Respiratory: Normal respiratory effort, no problems with respiration noted  Abdomen: Soft, gravid, appropriate for gestational age. Pain/Pressure: Present     Pelvic:  Cervical exam deferred      [facility] on [facility] exam  Extremities: Normal range of motion.  Edema: None  Mental Status: Normal mood and affect. Normal behavior. Normal judgment and thought content.   Urinalysis:      Assessment and Plan:  Pregnancy: [id number] at [redacted]w[redacted]d  1. Supervision of other normal pregnancy, antepartum   Term labor symptoms and general obstetric precautions including but not limited to vaginal bleeding, contractions, leaking of fluid and fetal movement were reviewed in detail with the patient. Please refer to After Visit Summary for other counseling recommendations.   Return in about 1 week (around 08/17/2020) for MyChart.   Brock Bad, MD  08/10/20

## 2020-08-18 ENCOUNTER — Encounter: Payer: Self-pay | Admitting: Obstetrics

## 2020-08-18 ENCOUNTER — Telehealth (INDEPENDENT_AMBULATORY_CARE_PROVIDER_SITE_OTHER): Payer: Medicaid Other | Admitting: Obstetrics

## 2020-08-18 VITALS — BP 122/73 | HR 74

## 2020-08-18 DIAGNOSIS — Z3480 Encounter for supervision of other normal pregnancy, unspecified trimester: Secondary | ICD-10-CM

## 2020-08-18 DIAGNOSIS — Z3A37 37 weeks gestation of pregnancy: Secondary | ICD-10-CM

## 2020-08-18 DIAGNOSIS — Z3483 Encounter for supervision of other normal pregnancy, third trimester: Secondary | ICD-10-CM

## 2020-08-18 NOTE — Progress Notes (Signed)
Subjective:  Samantha Carroll is a 24 y.o. [phone] at [redacted]w[redacted]d being seen today for ongoing prenatal care.  She is currently monitored for the following issues for this low-risk pregnancy and has Adjustment disorder with depressed mood; Supervision of other normal pregnancy, antepartum; History of nephrolithiasis; Changes in vision; and Positive GBS test on their problem list.  Patient reports pelvic pressure.  Contractions: Irritability. Vag. Bleeding: None.  Movement: Present. Denies leaking of fluid.   The following portions of the patient's history were reviewed and updated as appropriate: allergies, current medications, past family history, past medical history, past social history, past surgical history and problem list. Problem list updated.  Objective:   Vitals:   08/18/20 0938  BP: 122/73  Pulse: 74    Fetal Status:     Movement: Present     General:  Alert, oriented and cooperative. Patient is in no acute distress.  Skin: Skin is warm and dry. No rash noted.   Cardiovascular: Normal heart rate noted  Respiratory: Normal respiratory effort, no problems with respiration noted  Abdomen: Soft, gravid, appropriate for gestational age. Pain/Pressure: Present     Pelvic:  Cervical exam deferred        Extremities: Normal range of motion.  Edema: None  Mental Status: Normal mood and affect. Normal behavior. Normal judgment and thought content.   Urinalysis:      Assessment and Plan:  Pregnancy: [id number] at [redacted]w[redacted]d  1. Supervision of other normal pregnancy, antepartum   Term labor symptoms and general obstetric precautions including but not limited to vaginal bleeding, contractions, leaking of fluid and fetal movement were reviewed in detail with the patient. Please refer to After Visit Summary for other counseling recommendations.   Return in about 1 week (around 08/25/2020) for ROB.   Brock Bad, MD  08/18/20

## 2020-08-18 NOTE — Progress Notes (Signed)
I connected with  Samantha Carroll on 08/18/20 by a video enabled telemedicine application and verified that I am speaking with the correct person using two identifiers.   I discussed the limitations of evaluation and management by telemedicine. The patient expressed understanding and agreed to proceed.  [facility], c/o lots of pressure.

## 2020-08-24 ENCOUNTER — Ambulatory Visit (INDEPENDENT_AMBULATORY_CARE_PROVIDER_SITE_OTHER): Payer: Medicaid Other | Admitting: Obstetrics and Gynecology

## 2020-08-24 ENCOUNTER — Other Ambulatory Visit: Payer: Self-pay

## 2020-08-24 VITALS — BP 114/76 | HR 88 | Wt 182.5 lb

## 2020-08-24 DIAGNOSIS — B951 Streptococcus, group B, as the cause of diseases classified elsewhere: Secondary | ICD-10-CM

## 2020-08-24 DIAGNOSIS — Z3A38 38 weeks gestation of pregnancy: Secondary | ICD-10-CM

## 2020-08-24 DIAGNOSIS — Z3480 Encounter for supervision of other normal pregnancy, unspecified trimester: Secondary | ICD-10-CM

## 2020-08-24 NOTE — Patient Instructions (Addendum)
Third Trimester of Pregnancy  The third trimester is from week 28 through week 40 (months 7 through 9). This trimester is when your unborn baby (fetus) is growing very fast. At the end of the ninth month, the unborn baby is about 20 inches in length. It weighs about 6-10 pounds. Follow these instructions at home: Medicines  Take over-the-counter and prescription medicines only as told by your doctor. Some medicines are safe and some medicines are not safe during pregnancy.  Take a prenatal vitamin that contains at least 600 micrograms (mcg) of folic acid.  If you have trouble pooping (constipation), take medicine that will make your stool soft (stool softener) if your doctor approves. Eating and drinking   Eat regular, healthy meals.  Avoid raw meat and uncooked cheese.  If you get low calcium from the food you eat, talk to your doctor about taking a daily calcium supplement.  Eat four or five small meals rather than three large meals a day.  Avoid foods that are high in fat and sugars, such as fried and sweet foods.  To prevent constipation: ? Eat foods that are high in fiber, like fresh fruits and vegetables, whole grains, and beans. ? Drink enough fluids to keep your pee (urine) clear or pale yellow. Activity  Exercise only as told by your doctor. Stop exercising if you start to have cramps.  Avoid heavy lifting, wear low heels, and sit up straight.  Do not exercise if it is too hot, too humid, or if you are in a place of great height (high altitude).  You may continue to have sex unless your doctor tells you not to. Relieving pain and discomfort  Wear a good support bra if your breasts are tender.  Take frequent breaks and rest with your legs raised if you have leg cramps or low back pain.  Take warm water baths (sitz baths) to soothe pain or discomfort caused by hemorrhoids. Use hemorrhoid cream if your doctor approves.  If you develop puffy, bulging veins (varicose  veins) in your legs: ? Wear support hose or compression stockings as told by your doctor. ? Raise (elevate) your feet for 15 minutes, 3-4 times a day. ? Limit salt in your food. Safety  Wear your seat belt when driving.  Make a list of emergency phone numbers, including numbers for family, friends, the hospital, and police and fire departments. Preparing for your baby's arrival To prepare for the arrival of your baby:  Take prenatal classes.  Practice driving to the hospital.  Visit the hospital and tour the maternity area.  Talk to your work about taking leave once the baby comes.  Pack your hospital bag.  Prepare the baby's room.  Go to your doctor visits.  Buy a rear-facing car seat. Learn how to install it in your car. General instructions  Do not use hot tubs, steam rooms, or saunas.  Do not use any products that contain nicotine or tobacco, such as cigarettes and e-cigarettes. If you need help quitting, ask your doctor.  Do not drink alcohol.  Do not douche or use tampons or scented sanitary pads.  Do not cross your legs for long periods of time.  Do not travel for long distances unless you must. Only do so if your doctor says it is okay.  Visit your dentist if you have not gone during your pregnancy. Use a soft toothbrush to brush your teeth. Be gentle when you floss.  Avoid cat litter boxes and soil   used by cats. These carry germs that can cause birth defects in the baby and can cause a loss of your baby (miscarriage) or stillbirth.  Keep all your prenatal visits as told by your doctor. This is important. Contact a doctor if:  You are not sure if you are in labor or if your water has broken.  You are dizzy.  You have mild cramps or pressure in your lower belly.  You have a nagging pain in your belly area.  You continue to feel sick to your stomach, you throw up, or you have watery poop.  You have bad smelling fluid coming from your vagina.  You have  pain when you pee. Get help right away if:  You have a fever.  You are leaking fluid from your vagina.  You are spotting or bleeding from your vagina.  You have severe belly cramps or pain.  You lose or gain weight quickly.  You have trouble catching your breath and have chest pain.  You notice sudden or extreme puffiness (swelling) of your face, hands, ankles, feet, or legs.  You have not felt the baby move in over an hour.  You have severe headaches that do not go away with medicine.  You have trouble seeing.  You are leaking, or you are having a gush of fluid, from your vagina before you are 37 weeks.  You have regular belly spasms (contractions) before you are 37 weeks. Summary  The third trimester is from week 28 through week 40 (months 7 through 9). This time is when your unborn baby is growing very fast.  Follow your doctor's advice about medicine, food, and activity.  Get ready for the arrival of your baby by taking prenatal classes, getting all the baby items ready, preparing the baby's room, and visiting your doctor to be checked.  Get help right away if you are bleeding from your vagina, or you have chest pain and trouble catching your breath, or if you have not felt your baby move in over an hour. This information is not intended to replace advice given to you by your health care provider. Make sure you discuss any questions you have with your health care provider. Document Revised: 12/04/2018 Document Reviewed: 09/18/2016 Elsevier Patient Education  2020 [employer]  Labor Induction  Labor induction is when steps are taken to cause a pregnant woman to begin the labor process. Most women go into labor on their own between 37 weeks and 42 weeks of pregnancy. When this does not happen or when there is a medical need for labor to begin, steps may be taken to induce labor. Labor induction causes a pregnant woman's uterus to contract. It also causes the cervix to  soften (ripen), open (dilate), and thin out (efface). Usually, labor is not induced before 39 weeks of pregnancy unless there is a medical reason to do so. Your health care provider will determine if labor induction is needed. Before inducing labor, your health care provider will consider a number of factors, including:  Your medical condition and your baby's.  How many weeks along you are in your pregnancy.  How mature your baby's lungs are.  The condition of your cervix.  The position of your baby.  The size of your birth canal. What are some reasons for labor induction? Labor may be induced if:  Your health or your baby's health is at risk.  Your pregnancy is overdue by 1 week or more.  Your water breaks   but labor does not start on its own.  There is a low amount of amniotic fluid around your baby. You may also choose (elect) to have labor induced at a certain time. Generally, elective labor induction is done no earlier than 39 weeks of pregnancy. What methods are used for labor induction? Methods used for labor induction include:  Prostaglandin medicine. This medicine starts contractions and causes the cervix to dilate and ripen. It can be taken by mouth (orally) or by being inserted into the vagina (suppository).  Inserting a small, thin tube (catheter) with a balloon into the vagina and then expanding the balloon with water to dilate the cervix.  Stripping the membranes. In this method, your health care provider gently separates amniotic sac tissue from the cervix. This causes the cervix to stretch, which in turn causes the release of a hormone called progesterone. The hormone causes the uterus to contract. This procedure is often done during an office visit, after which you will be sent home to wait for contractions to begin.  Breaking the water. In this method, your health care provider uses a small instrument to make a small hole in the amniotic sac. This eventually causes  the amniotic sac to break. Contractions should begin after a few hours.  Medicine to trigger or strengthen contractions. This medicine is given through an IV that is inserted into a vein in your arm. Except for membrane stripping, which can be done in a clinic, labor induction is done in the hospital so that you and your baby can be carefully monitored. How long does it take for labor to be induced? The length of time it takes to induce labor depends on how ready your body is for labor. Some inductions can take up to 2-3 days, while others may take less than a day. Induction may take longer if:  You are induced early in your pregnancy.  It is your first pregnancy.  Your cervix is not ready. What are some risks associated with labor induction? Some risks associated with labor induction include:  Changes in fetal heart rate, such as being too high, too low, or irregular (erratic).  Failed induction.  Infection in the mother or the baby.  Increased risk of having a cesarean delivery.  Fetal death.  Breaking off (abruption) of the placenta from the uterus (rare).  Rupture of the uterus (very rare). When induction is needed for medical reasons, the benefits of induction generally outweigh the risks. What are some reasons for not inducing labor? Labor induction should not be done if:  Your baby does not tolerate contractions.  You have had previous surgeries on your uterus, such as a myomectomy, removal of fibroids, or a vertical scar from a previous cesarean delivery.  Your placenta lies very low in your uterus and blocks the opening of the cervix (placenta previa).  Your baby is not in a head-down position.  The umbilical cord drops down into the birth canal in front of the baby.  There are unusual circumstances, such as the baby being very early (premature).  You have had more than 2 previous cesarean deliveries. Summary  Labor induction is when steps are taken to cause a  pregnant woman to begin the labor process.  Labor induction causes a pregnant woman's uterus to contract. It also causes the cervix to ripen, dilate, and efface.  Labor is not induced before 39 weeks of pregnancy unless there is a medical reason to do so.  When induction is needed   for medical reasons, the benefits of induction generally outweigh the risks. This information is not intended to replace advice given to you by your health care provider. Make sure you discuss any questions you have with your health care provider. Document Revised: 08/16/2017 Document Reviewed: 09/26/2016 Elsevier Patient Education  2020 Elsevier Inc.  

## 2020-08-24 NOTE — Progress Notes (Signed)
   PRENATAL VISIT NOTE  Subjective:  Samantha Carroll is a 23 y.o. [id number] at [redacted]w[redacted]d being seen today for ongoing prenatal care.  She is currently monitored for the following issues for this low-risk pregnancy and has Adjustment disorder with depressed mood; Supervision of other normal pregnancy, antepartum; History of nephrolithiasis; Changes in vision; Positive GBS test; and [redacted] weeks gestation of pregnancy on their problem list.  Patient doing well with no acute concerns today. She reports occasional contractions.  Contractions: Irregular. Vag. Bleeding: None.  Movement: Present. Denies leaking of fluid.   The following portions of the patient's history were reviewed and updated as appropriate: allergies, current medications, past family history, past medical history, past social history, past surgical history and problem list. Problem list updated.  Objective:   Vitals:   08/24/20 1109  BP: 114/76  Pulse: 88  Weight: 182 lb 8 oz (82.8 kg)    Fetal Status: Fetal Heart Rate (bpm): 145 Fundal Height: 39 cm Movement: Present  Presentation: Vertex  General:  Alert, oriented and cooperative. Patient is in no acute distress.  Skin: Skin is warm and dry. No rash noted.   Cardiovascular: Normal heart rate noted  Respiratory: Normal respiratory effort, no problems with respiration noted  Abdomen: Soft, gravid, appropriate for gestational age.  Pain/Pressure: Present     Pelvic: Cervical exam performed Dilation: 1 Effacement (%): 60 Station: -2  Extremities: Normal range of motion.     Mental Status:  Normal mood and affect. Normal behavior. Normal judgment and thought content.   Assessment and Plan:  Pregnancy: [id number] at [redacted]w[redacted]d  1. Positive GBS test Treat in labor  2. Supervision of other normal pregnancy, antepartum Induction at 40 weeks, pt request   3. [redacted] weeks gestation of pregnancy   Term labor symptoms and general obstetric precautions including but not limited to vaginal  bleeding, contractions, leaking of fluid and fetal movement were reviewed in detail with the patient.  Please refer to After Visit Summary for other counseling recommendations.   Return in about 1 week (around 08/31/2020) for ROB, in person.   Mariel Aloe, MD

## 2020-08-24 NOTE — Progress Notes (Signed)
Pt is doing well, would like cervix check today.

## 2020-08-25 ENCOUNTER — Telehealth (HOSPITAL_COMMUNITY): Payer: Self-pay | Admitting: *Deleted

## 2020-08-25 NOTE — Telephone Encounter (Signed)
Preadmission screen  

## 2020-08-27 ENCOUNTER — Other Ambulatory Visit: Payer: Self-pay | Admitting: Advanced Practice Midwife

## 2020-08-27 NOTE — L&D Delivery Note (Signed)
Samantha Carroll is a 25 y.o. [phone] s/p SVD at [redacted]w[redacted]d. She was admitted for SOL- active labor.   ROM: 0h 18m with heavy meconium fluid GBS Status: Positive - inadequate treatment Maximum Maternal Temperature: 98  Labor Progress and Delivery: . She progressed without augmentation to complete, patient requested AROM at complete and pushed 8 minutes to deliver. Head delivered direct OP. NICU at delivery due to heavy meconium. Nuchal cord x2 present, easily reduced. Cord wrapped around arm during delivery. Shoulder and body delivered in usual fashion. Infant with spontaneous cry, held in CNM's arms, dried and stimulated per [name]'s request due to BUFA. Cord clamped x 2 after 1-minute delay, and cut by [name]- the adoptive mom. Baby given to [facility] to perform skin to skin. Cord blood drawn. Placenta delivered spontaneously with gentle cord traction. Fundus firm with massage and Pitocin. Labia, perineum, vagina, and cervix inspected inspected with 1st degree laceration, not repaired hemostatic.She requests Nexplanon for birth control.  Delivery Note At 3:38 AM a viable and healthy female was delivered via Vaginal, Spontaneous (Presentation:   Occiput Anterior).  APGAR: 8, 9; weight pending at time of note.   Placenta status: Spontaneous, Intact.  Cord: 3 vessels with the following complications: None.    Anesthesia: Epidural Episiotomy: None Lacerations: 1st degree Suture Repair: None Est. Blood Loss (mL): 50  Mom to postpartum.  Baby to adoptive mother -[name] .  Sharyon Cable CNM 08/30/2020, 4:17 AM

## 2020-08-29 ENCOUNTER — Telehealth (HOSPITAL_COMMUNITY): Payer: Self-pay | Admitting: *Deleted

## 2020-08-29 NOTE — Telephone Encounter (Signed)
Preadmission screen  

## 2020-08-30 ENCOUNTER — Encounter (HOSPITAL_COMMUNITY): Payer: Self-pay | Admitting: Obstetrics and Gynecology

## 2020-08-30 ENCOUNTER — Inpatient Hospital Stay (HOSPITAL_COMMUNITY): Payer: Medicaid Other | Admitting: Anesthesiology

## 2020-08-30 ENCOUNTER — Inpatient Hospital Stay (HOSPITAL_COMMUNITY)
Admission: EM | Admit: 2020-08-30 | Discharge: 2020-08-31 | DRG: 807 | Disposition: A | Discharge status: Home / Self Care | Payer: Medicaid Other | Attending: Obstetrics and Gynecology | Admitting: Obstetrics and Gynecology

## 2020-08-30 ENCOUNTER — Other Ambulatory Visit: Payer: Self-pay

## 2020-08-30 DIAGNOSIS — F4321 Adjustment disorder with depressed mood: Secondary | ICD-10-CM | POA: Diagnosis present

## 2020-08-30 DIAGNOSIS — Z20822 Contact with and (suspected) exposure to covid-19: Secondary | ICD-10-CM | POA: Diagnosis present

## 2020-08-30 DIAGNOSIS — Z87891 Personal history of nicotine dependence: Secondary | ICD-10-CM

## 2020-08-30 DIAGNOSIS — O26893 Other specified pregnancy related conditions, third trimester: Secondary | ICD-10-CM | POA: Diagnosis present

## 2020-08-30 DIAGNOSIS — O99344 Other mental disorders complicating childbirth: Secondary | ICD-10-CM | POA: Diagnosis present

## 2020-08-30 DIAGNOSIS — Z30017 Encounter for initial prescription of implantable subdermal contraceptive: Secondary | ICD-10-CM

## 2020-08-30 DIAGNOSIS — Z3A39 39 weeks gestation of pregnancy: Secondary | ICD-10-CM

## 2020-08-30 DIAGNOSIS — O99824 Streptococcus B carrier state complicating childbirth: Secondary | ICD-10-CM

## 2020-08-30 DIAGNOSIS — Z30013 Encounter for initial prescription of injectable contraceptive: Secondary | ICD-10-CM | POA: Diagnosis not present

## 2020-08-30 DIAGNOSIS — Z87442 Personal history of urinary calculi: Secondary | ICD-10-CM | POA: Diagnosis present

## 2020-08-30 DIAGNOSIS — F53 Postpartum depression: Secondary | ICD-10-CM | POA: Diagnosis present

## 2020-08-30 LAB — RESP PANEL BY RT-PCR (FLU A&B, COVID) ARPGX2
Influenza A by PCR: NEGATIVE
Influenza B by PCR: NEGATIVE
SARS Coronavirus 2 by RT PCR: NEGATIVE

## 2020-08-30 LAB — CBC
HCT: 34.5 % — ABNORMAL LOW (ref 36.0–46.0)
Hemoglobin: 11.2 g/dL — ABNORMAL LOW (ref 12.0–15.0)
MCH: 28.4 pg (ref 26.0–34.0)
MCHC: 32.5 g/dL (ref 30.0–36.0)
MCV: 87.3 fL (ref 80.0–100.0)
Platelets: 274 10*3/uL (ref 150–400)
RBC: 3.95 MIL/uL (ref 3.87–5.11)
RDW: 13.9 % (ref 11.5–15.5)
WBC: 10.9 10*3/uL — ABNORMAL HIGH (ref 4.0–10.5)
nRBC: 0 % (ref 0.0–0.2)

## 2020-08-30 LAB — RPR: RPR Ser Ql: NONREACTIVE

## 2020-08-30 LAB — TYPE AND SCREEN
ABO/RH(D): O POS
Antibody Screen: NEGATIVE

## 2020-08-30 MED ORDER — ACETAMINOPHEN 325 MG PO TABS
650.0000 mg | ORAL_TABLET | ORAL | Status: DC | PRN
  Administered 2020-08-30 – 2020-08-31 (×4): 650 mg via ORAL
  Filled 2020-08-30 (×4): qty 2

## 2020-08-30 MED ORDER — SODIUM CHLORIDE 0.9 % IV SOLN: 1.0000 g | INTRAVENOUS | Status: DC

## 2020-08-30 MED ORDER — SOD CITRATE-CITRIC ACID 500-334 MG/5ML PO SOLN: 30.0000 mL | ORAL | Status: DC | PRN

## 2020-08-30 MED ORDER — PRENATAL MULTIVITAMIN CH
1.0000 | ORAL_TABLET | Freq: Every day | ORAL | Status: DC
  Administered 2020-08-30: 1 via ORAL
  Filled 2020-08-30: qty 1

## 2020-08-30 MED ORDER — ETONOGESTREL 68 MG ~~LOC~~ IMPL
68.0000 mg | DRUG_IMPLANT | Freq: Once | SUBCUTANEOUS | Status: AC
  Administered 2020-08-30: 68 mg via SUBCUTANEOUS
  Filled 2020-08-30: qty 1

## 2020-08-30 MED ORDER — ZOLPIDEM TARTRATE 5 MG PO TABS: 5.0000 mg | ORAL_TABLET | Freq: Every evening | ORAL | Status: DC | PRN

## 2020-08-30 MED ORDER — HYDROXYZINE HCL 50 MG PO TABS: 50.0000 mg | ORAL_TABLET | Freq: Four times a day (QID) | ORAL | Status: DC | PRN

## 2020-08-30 MED ORDER — SODIUM CHLORIDE 0.9 % IV SOLN: 5.0000 10*6.[IU] | Freq: Once | INTRAVENOUS | Status: DC

## 2020-08-30 MED ORDER — LIDOCAINE HCL 1 % IJ SOLN
0.0000 mL | Freq: Once | INTRAMUSCULAR | Status: AC | PRN
  Administered 2020-08-30: 2 mL via INTRADERMAL
  Filled 2020-08-30: qty 20

## 2020-08-30 MED ORDER — ONDANSETRON HCL 4 MG PO TABS: 4.0000 mg | ORAL_TABLET | ORAL | Status: DC | PRN

## 2020-08-30 MED ORDER — EPHEDRINE 5 MG/ML INJ: 10.0000 mg | INTRAVENOUS | Status: DC | PRN

## 2020-08-30 MED ORDER — FENTANYL CITRATE (PF) 100 MCG/2ML IJ SOLN
50.0000 ug | INTRAMUSCULAR | Status: DC | PRN
  Administered 2020-08-30: 100 ug via INTRAVENOUS
  Filled 2020-08-30: qty 2

## 2020-08-30 MED ORDER — DIPHENHYDRAMINE HCL 25 MG PO CAPS: 25.0000 mg | ORAL_CAPSULE | Freq: Four times a day (QID) | ORAL | Status: DC | PRN

## 2020-08-30 MED ORDER — SODIUM CHLORIDE 0.9 % IV SOLN
2.0000 g | Freq: Once | INTRAVENOUS | Status: AC
  Administered 2020-08-30: 2 g via INTRAVENOUS
  Filled 2020-08-30: qty 2000

## 2020-08-30 MED ORDER — WITCH HAZEL-GLYCERIN EX PADS: 1.0000 "application " | MEDICATED_PAD | CUTANEOUS | Status: DC | PRN

## 2020-08-30 MED ORDER — LIDOCAINE HCL (PF) 1 % IJ SOLN: 30.0000 mL | INTRAMUSCULAR | Status: DC | PRN

## 2020-08-30 MED ORDER — SIMETHICONE 80 MG PO CHEW: 80.0000 mg | CHEWABLE_TABLET | ORAL | Status: DC | PRN

## 2020-08-30 MED ORDER — TETANUS-DIPHTH-ACELL PERTUSSIS 5-2.5-18.5 LF-MCG/0.5 IM SUSY: 0.5000 mL | PREFILLED_SYRINGE | Freq: Once | INTRAMUSCULAR | Status: DC

## 2020-08-30 MED ORDER — SODIUM CHLORIDE (PF) 0.9 % IJ SOLN
INTRAMUSCULAR | Status: DC | PRN
  Administered 2020-08-30: 12 mL/h via EPIDURAL

## 2020-08-30 MED ORDER — LACTATED RINGERS IV SOLN: 500.0000 mL | INTRAVENOUS | Status: DC | PRN

## 2020-08-30 MED ORDER — OXYCODONE-ACETAMINOPHEN 5-325 MG PO TABS: 2.0000 | ORAL_TABLET | ORAL | Status: DC | PRN

## 2020-08-30 MED ORDER — PHENYLEPHRINE 40 MCG/ML (10ML) SYRINGE FOR IV PUSH (FOR BLOOD PRESSURE SUPPORT): 80.0000 ug | PREFILLED_SYRINGE | INTRAVENOUS | Status: DC | PRN

## 2020-08-30 MED ORDER — LACTATED RINGERS IV SOLN
500.0000 mL | Freq: Once | INTRAVENOUS | Status: AC
  Administered 2020-08-30: 500 mL via INTRAVENOUS

## 2020-08-30 MED ORDER — ACETAMINOPHEN 325 MG PO TABS: 650.0000 mg | ORAL_TABLET | ORAL | Status: DC | PRN

## 2020-08-30 MED ORDER — LACTATED RINGERS IV SOLN: INTRAVENOUS | Status: DC

## 2020-08-30 MED ORDER — BENZOCAINE-MENTHOL 20-0.5 % EX AERO
1.0000 "application " | INHALATION_SPRAY | CUTANEOUS | Status: DC | PRN
  Administered 2020-08-31: 1 via TOPICAL
  Filled 2020-08-30: qty 56

## 2020-08-30 MED ORDER — DIBUCAINE (PERIANAL) 1 % EX OINT: 1.0000 "application " | TOPICAL_OINTMENT | CUTANEOUS | Status: DC | PRN

## 2020-08-30 MED ORDER — OXYCODONE-ACETAMINOPHEN 5-325 MG PO TABS
1.0000 | ORAL_TABLET | ORAL | Status: DC | PRN
Start: 2020-08-30 — End: 2020-08-30

## 2020-08-30 MED ORDER — FENTANYL-BUPIVACAINE-NACL 0.5-0.125-0.9 MG/250ML-% EP SOLN
12.0000 mL/h | EPIDURAL | Status: DC | PRN
  Filled 2020-08-30: qty 250

## 2020-08-30 MED ORDER — OXYTOCIN-SODIUM CHLORIDE 30-0.9 UT/500ML-% IV SOLN
2.5000 [IU]/h | INTRAVENOUS | Status: DC
  Filled 2020-08-30: qty 500

## 2020-08-30 MED ORDER — ONDANSETRON HCL 4 MG/2ML IJ SOLN
4.0000 mg | Freq: Four times a day (QID) | INTRAMUSCULAR | Status: DC | PRN
  Administered 2020-08-30: 4 mg via INTRAVENOUS
  Filled 2020-08-30: qty 2

## 2020-08-30 MED ORDER — LIDOCAINE HCL (PF) 1 % IJ SOLN
INTRAMUSCULAR | Status: DC | PRN
  Administered 2020-08-30: 10 mL via EPIDURAL

## 2020-08-30 MED ORDER — OXYTOCIN BOLUS FROM INFUSION
333.0000 mL | Freq: Once | INTRAVENOUS | Status: AC
  Administered 2020-08-30: 333 mL via INTRAVENOUS

## 2020-08-30 MED ORDER — IBUPROFEN 600 MG PO TABS
600.0000 mg | ORAL_TABLET | Freq: Four times a day (QID) | ORAL | Status: DC
  Administered 2020-08-30 – 2020-08-31 (×4): 600 mg via ORAL
  Filled 2020-08-30 (×4): qty 1

## 2020-08-30 MED ORDER — SENNOSIDES-DOCUSATE SODIUM 8.6-50 MG PO TABS
2.0000 | ORAL_TABLET | ORAL | Status: DC
  Administered 2020-08-30 – 2020-08-31 (×2): 2 via ORAL
  Filled 2020-08-30 (×2): qty 2

## 2020-08-30 MED ORDER — PENICILLIN G POT IN DEXTROSE 60000 UNIT/ML IV SOLN: 3.0000 10*6.[IU] | INTRAVENOUS | Status: DC

## 2020-08-30 MED ORDER — DIPHENHYDRAMINE HCL 50 MG/ML IJ SOLN: 12.5000 mg | INTRAMUSCULAR | Status: DC | PRN

## 2020-08-30 MED ORDER — COCONUT OIL OIL: 1.0000 "application " | TOPICAL_OIL | Status: DC | PRN

## 2020-08-30 MED ORDER — ESCITALOPRAM OXALATE 10 MG PO TABS
10.0000 mg | ORAL_TABLET | Freq: Every day | ORAL | Status: DC
  Administered 2020-08-31: 10 mg via ORAL
  Filled 2020-08-30: qty 1

## 2020-08-30 MED ORDER — ONDANSETRON HCL 4 MG/2ML IJ SOLN: 4.0000 mg | INTRAMUSCULAR | Status: DC | PRN

## 2020-08-30 NOTE — Progress Notes (Signed)
CNM at bedside during epidural and after. Due to patient being multip and 9cm prior to epidural placement.   Patient reports intermittent pressure in bottom.   Vitals:   08/30/20 0241 08/30/20 0242 08/30/20 0247 08/30/20 0252  BP:  (!) 101/54 109/66 115/65  Pulse:  (!) 172 76 75  Resp:    18  Temp:      TempSrc:      SpO2: 100%   97%  Weight:      Height:       Dilation: 9 Effacement (%): 90 Station: 0 Presentation: Vertex Exam by:: Jacqulyn Liner, RN  GBS positive - amp given @ 0118  Expectant management   FHR- 125/moderate/10x10 accels/ no decelerations  TOCO: 2-3 minutes    Sharyon Cable, CNM 08/30/20, 2:58 AM

## 2020-08-30 NOTE — Procedures (Cosign Needed Addendum)
Post-Placental Nexplanon Insertion Procedure Note  Patient was identified. Informed consent was signed, signed copy in chart. A time-out was performed.    The insertion site was identified 8-10 cm (3-4 inches) from the medial epicondyle of the humerus and 3-5 cm (1.25-2 inches) posterior to (below) the sulcus (groove) between the biceps and triceps muscles of the patient's left arm and marked. The site was prepped and draped in the usual sterile fashion. Pt was prepped with alcohol swab and then injected with 1 cc of 1% lidocaine. The site was prepped with betadine. Nexplanon removed form packaging,  Device confirmed in needle, then inserted full length of needle and withdrawn per handbook instructions. Provider and patient verified presence of the implant in the woman's arm by palpation. Pt insertion site was covered with steristrips/adhesive bandage and pressure bandage. There was minimal blood loss. Patient tolerated procedure well.  Patient was given post procedure instructions and Nexplanon user card with expiration date. Condoms were recommended for STI prevention. Patient was asked to keep the pressure dressing on for 24 hours to minimize bruising and keep the adhesive bandage on for 3-5 days. The patient verbalized understanding of the plan of care and agrees.   Lot # [id number] Expiration Date 2022/12/02  Shirlean Mylar, MD [facility] Family Medicine Residency, PGY-2  I saw and evaluated the patient. I agree with the findings and the plan of care as documented in the resident's note.  Casper Harrison, MD [facility] Family Medicine Fellow, [facility] for [facility], [facility]

## 2020-08-30 NOTE — Progress Notes (Signed)
S: Patient reports symptoms of depression and anxiety with this pregnancy, compounded by decision for adoption. She has been having low mood, little interest of pleasure in activities, insomnia, and poor appetite. Denies SI, HI, VAH.  She has been diagnosed with depression and anxiety in the past, is starting counseling with LCSW at [facility], but has never used SSRIs. She has no history of BPD, no symptoms of mania, MDQ negative. PHQ-9 score of 10, no SI. Edinburgh score of 8 for anhedonia and feeling overwhelmed. GAD7 score of 7, indicating moderate anxiety symptoms. She reports moderate difficulty with these symptoms. She is interested in establishing care with me as PCP and starting SSRI.  Obj: GEN: tired-appearing, young, AAW, resting comfortably in bed, NAD, WNWD HEENT: NCAT. Sclera without injection or icterus. MMM.  Neuro: Alert and oriented, at baseline Ext: no edema Psych: Pleasant and appropriate, tearful at times  A&P:  MDD, moderate with anxious features Patient is appropriate for trial of SSRI, will also be starting counseling with LCSW. I have scheduled follow up appointment with me to establish as PCP and for medication management. Counseled on common side effects, importance of compliance and set expectations regarding 4-6w for adequate trial of SSRI. Discussed resources in case of crisis [facility]). Will have LCSW give patient list of resources as well.  Start lexapro 10 mg qd for 7 days, then increase to 20 mg (two tablets) thereafter.  Follow up by phone in 1 week with me, and then in person on 09/19/20 at [facility] with me  Shirlean Mylar, MD [facility] Family Medicine Residency, PGY-2

## 2020-08-30 NOTE — H&P (Signed)
LABOR AND DELIVERY ADMISSION HISTORY AND PHYSICAL NOTE  Samantha Carroll is a 25 y.o. female [phone] with IUP at 30w4dby [place]ore[place] for SOL. Patient reports contractions started occurring at 7pm.  She reports positive fetal movement. She denies leakage of fluid or vaginal bleeding.   Baby is up for adoption - patient would like to see baby but does not want to hold baby or have baby skin to skin after delivery. Baby to warmer for weights/measurements then to adoptive mother MJinny Blossomthat is at bedside.   Pren[facility] History/Complications: [facility] Pregnancy complications:  - Anxiety and Depression  - Chlamydia during pregnancy - TOC negative on 9/14  Past Medical History: Past Medical History:  Diagnosis Date  . Anxiety   . Breast mass   . Depression    comes and goes, is good right now  . Kidney stones     Past Surgical History: Past Surgical History:  Procedure Laterality Date  . EYE SURGERY    . INDUCED ABORTION    . REFRACTIVE SURGERY     RT eye  . WISDOM TOOTH EXTRACTION      Obstetrical History: OB History    Gravida  4   Para  2   Term  2   Preterm  0   AB  1   Living  2     SAB  0   IAB  1   Ectopic  0   Multiple      Live Births  2        Obstetric Comments  2nd preg previa- resolved before labor        Social History: Social History   Socioeconomic History  . Marital status: Single    Spouse name: Not on file  . Number of children: Not on file  . Years of education: Not on file  . Highest education level: Not on file  Occupational History  . Not on file  Tobacco Use  . Smoking status: Former Smoker    Packs/day: 1.00    Years: 10.00    Pack years: 10.00    Types: Cigarettes    Quit date: 01/14/2020    Years since quitting: 0.6  . Smokeless tobacco: Never Used  Vaping Use  . Vaping Use: Former  Substance and Sexual Activity  . Alcohol use: Not Currently    Alcohol/week: 7.0 standard drinks    Types: 7 Shots of  liquor per week    Comment: wkly  . Drug use: No  . Sexual activity: Yes    Partners: Female, Female    Birth control/protection: Condom  Other Topics Concern  . Not on file  Social History Narrative   ** Merged History Encounter **       Social Determinants of Health   Financial Resource Strain: Not on file  Food Insecurity: Not on file  Transportation Needs: Not on file  Physical Activity: Not on file  Stress: Not on file  Social Connections: Not on file    Family History: Family History  Problem Relation Age of Onset  . Brain cancer Father   . Asthma Father   . Breast cancer Cousin   . Healthy Mother     Allergies: Allergies  Allergen Reactions  . Pineapple     Oral itching and swelling    Medications Prior to Admission  Medication Sig Dispense Refill Last Dose  . Blood Pressure Monitoring (BLOOD PRESSURE KIT) DEVI 1 kit by Does not  apply route as needed. 1 each 0   . cetirizine (ZYRTEC ALLERGY) 10 MG tablet Take 1 tablet (10 mg total) by mouth daily. 30 tablet 1   . famotidine (PEPCID) 20 MG tablet Take 1 tablet (20 mg total) by mouth 2 (two) times daily. 60 tablet 1   . ferrous sulfate 325 (65 FE) MG EC tablet Take 1 tablet (325 mg total) by mouth every other day. 30 tablet 3   . fluticasone (FLONASE) 50 MCG/ACT nasal spray Place 1 spray into both nostrils daily. 10 mL 1   . metoCLOPramide (REGLAN) 10 MG tablet Take 1 tablet (10 mg total) by mouth every 6 (six) hours. 30 tablet 0   . Multiple Vitamins-Minerals (MULTIVITAMIN WITH MINERALS) tablet Take 1 tablet by mouth daily.      . ondansetron (ZOFRAN ODT) 4 MG disintegrating tablet Take 1 tablet (4 mg total) by mouth every 6 (six) hours as needed for nausea. 20 tablet 0   . Prenat w/o A-FeCbn-Meth-FA-DHA (PRENATE MINI) 29-0.6-0.4-350 MG CAPS Take 1 capsule by mouth daily before breakfast. (Patient not taking: Reported on 08/10/2020) 90 capsule 3   . scopolamine (TRANSDERM-SCOP) 1 MG/3DAYS Place 1 patch (1.5 mg  total) onto the skin every 3 (three) days. (Patient not taking: Reported on 08/10/2020) 10 patch 12      Review of Systems  All systems reviewed and negative except as stated in HPI  Physical Exam Blood pressure 110/72, pulse 92, temperature 97.9 F (36.6 C), resp. rate (!) 24, last menstrual period 12/18/2019, SpO2 99 %. General appearance: alert, cooperative and no distress Lungs: clear to auscultation bilaterally Heart: regular rate and rhythm Abdomen: soft, non-tender; bowel sounds normal Extremities: No calf swelling or tenderness Presentation: cephalic Fetal monitoring: 135/moderate/+accels/no decel  Uterine activity: 2-3 Dilation: 5.5 Effacement (%): 80 Station: 0 Exam by:: Eual Fines RN  Prenatal labs: ABO, Rh: O/Positive/-- (08/04 0913) Antibody: Negative (08/04 0913) Rubella: 2.69 (08/04 0913) RPR: Non Reactive (10/27 0937)  HBsAg: Negative (08/04 0913)  HIV: Non Reactive (10/27 0937)  GC/Chlamydia: negative (07/20/20) GBS: Positive/-- (12/10 1003)  2 hr Glucola: 81-121-77 Genetic screening:  Normal Mat21 Anatomy US: normal female   Prenatal Transfer Tool  Maternal Diabetes: No Genetic Screening: Normal Maternal Ultrasounds/Referrals: Normal Fetal Ultrasounds or other Referrals:  None Maternal Substance Abuse:  No Significant Maternal Medications:  None Significant Maternal Lab Results: Group B Strep positive  No results found for this or any previous visit (from the past 24 hour(s)).  Patient Active Problem List   Diagnosis Date Noted  . Encounter for elective induction of labor 08/30/2020  . [redacted] weeks gestation of pregnancy 08/24/2020  . Positive GBS test 08/07/2020  . Changes in vision 05/25/2020  . Supervision of other normal pregnancy, antepartum 03/24/2020  . History of nephrolithiasis 06/17/2017  . Adjustment disorder with depressed mood 03/17/2014    Assessment: Samantha Carroll is a 25 y.o. [phone] at 63w4dhere for SOL - active  labor   #Labor: Expectant management  #Pain: IV pain medication  #FWB: Cat I  #ID:  GBS positive- amp  #MOC: Nexplanon  RLajean Manes CNM  08/30/2020, 1:23 AM

## 2020-08-30 NOTE — Progress Notes (Signed)
0017: [facility] called for pt "39 weeks in active labor".  0025[name] at ED. Pt brought to Triage. Pt states her ctx started at 1930 and have continued to get closer together and stronger. Sees [facility] for [facility]. Pt reports bloody show. Pt states this is her third baby, previous normal vaginal deliveries. Pt also states that this is a BUFA.   0030: SVE 5.5/80/0. Pain rated 8/10. Fetal heart tones in 150s.   0038: Dr. Alysia Penna called and notified of pt 39.4 wks. G4P2. GBS +. Presenting to ED with c/o ctx since 1930 now closer and stronger. Sees [facility] for [facility]. Pt 5.5/80/0 w/ bloody show. FHT in 150s. Orders to admit to L&D.

## 2020-08-30 NOTE — Anesthesia Postprocedure Evaluation (Signed)
Anesthesia Post Note  Patient: Samantha Carroll  Procedure(s) Performed: AN AD HOC LABOR EPIDURAL     Patient location during evaluation: Mother Baby Anesthesia Type: Epidural Level of consciousness: awake, awake and alert and oriented Pain management: pain level controlled Vital Signs Assessment: post-procedure vital signs reviewed and stable Respiratory status: spontaneous breathing and respiratory function stable Cardiovascular status: blood pressure returned to baseline Postop Assessment: no headache, epidural receding, patient able to bend at knees, adequate PO intake, no backache, no apparent nausea or vomiting and able to ambulate Anesthetic complications: no   No complications documented.  Last Vitals:  Vitals:   08/30/20 0543 08/30/20 0716  BP: (!) 121/59 (!) 98/58  Pulse: 63 79  Resp: 18 16  Temp: 36.9 C 37 C  SpO2: 100% 100%    Last Pain:  Vitals:   08/30/20 1151  TempSrc:   PainSc: 6    Pain Goal:                Epidural/Spinal Function Cutaneous sensation: Normal sensation (08/30/20 1151), Patient able to flex knees: Yes (08/30/20 1151), Patient able to lift hips off bed: Yes (08/30/20 1151), Back pain beyond tenderness at insertion site: No (08/30/20 1151), Progressively worsening motor and/or sensory loss: No (08/30/20 1151), Bowel and/or bladder incontinence post epidural: No (08/30/20 1151)  Cleda Clarks

## 2020-08-30 NOTE — Clinical Social Work Maternal (Signed)
CLINICAL SOCIAL WORK MATERNAL/CHILD NOTE  Patient Details  Name: Samantha Carroll MRN: [id number] Date of Birth: [birth year]  Date:  08/30/2020  Clinical Social Worker Initiating Note:  Hortencia Pilar, LCSW Date/Time: Initiated:  08/30/20/1015     Child's Name:  Samantha Carroll   Biological Parents:  Mother Samantha Carroll)   Need for Interpreter:  None   Reason for Referral:  Adoption   Address:  [address] [place] [address]    Phone number:  [phone] (home)     Additional phone number: none   Household Members/Support Persons (HM/SP):   Household Member/Support Person 1   HM/SP Name Relationship DOB or Age  HM/SP -1  Samantha Carroll Biological MOB  11-11-1994  HM/SP -2 Samantha Carroll son 06/18/2017  HM/SP -3 Samantha Carroll daughter 04/25/2018  HM/SP -4        HM/SP -5        HM/SP -6        HM/SP -7        HM/SP -8          Natural Supports (not living in the home):  Immediate Family   Professional Supports: None   Employment: Full-time   Type of Work: Designer, multimedia   Education:    some college   Homebound arranged:  n/a  Surveyor, quantity Resources:  Medicaid   Other Resources:    none desired, however CSW provided MOB with Therapy resources.   Cultural/Religious Considerations Which May Impact Care:  none reported.   Strengths:  Compliance with medical plan    Psychotropic Medications:       MOB reported that she is not on medications as of this time but does report that she wants to start medication.   Pediatrician:     MOB expressed that she will have infant seen [facility] if needed but perspectiv adoptive mother expressed that infant will be seen by MD Myrtie Neither in [place].   Pediatrician List:   [facility]      Pediatrician Fax Number:    Risk Factors/Current Problems:  None   Cognitive State:  Alert ,Able to Concentrate  ,Insightful    Mood/Affect:  Interested ,Relaxed ,Comfortable ,Calm    CSW Assessment: CSW consulted due to infant BUFA. CSW went to speak with MOB at bedside to address further needs.   CSW congratulated both MOB and adoptive mother on the birth of infant CSW asked for permission to speak with MOB alone in which MOB and [name] were broth agreeable to. CSW then advised MOB of CSW's role and the reason for CSW coming to speak with her. MOB expressed that she was was never diagnosed with anxiety and depression but from working with different counselors MOB expressed that she was given these diagnosis. MOB expressed that she ha sonly ever been in therapy and has never taken any medication. MOB expressed that she is interested in starting medication however with "everyhthing that has been going on". MOB indicated that she had depression and anxiety during this pregnancy and expressed that yesterday was the worst day for her. MOB expressed again with everything that is occurring for her, she has had feeling of depression and anxiety. MOB also expressed that she thought that things would be worse for her but she feels okay at this time. MOB reported to CSW that she  has no other known mental health hx and denies SI. HI and DV to this CSW. CSW also advised MOB of ability to revoke adoption in the event that MOB wishes to do so. MOB expressed that she understood this and expressed no other questions on adoption to CSW.  MOB expressed that she has support from her aunt Tia. MOB also reported that she is from home with her two other children. MOB expressed that she and their father Samantha Carroll) share custody of the children "so they stay with him sometime and then they stay with me". MOB expressed that this was a decision hat was made by her and FOB. MOB reported that since she has given birth she has been feeling well and expressed a desire to discharge tomorrow. MOB reported that she has been in contact  with Norton Pastel from the [facility] and was advised that she would come tomorrow to complete paperwork.  CSW provided MOB with PPD education. MOB was given PPD Checklist in order to keep track of feelings as they may relate to PPD. MOB gave CSW permission to have Megan enter back into room so that CSW could give her SIDS education along with MOB. [name] reported that she has a basinet for infant to sleep in once arrived home and that she has located a possible Pediatrician for infant to be seen at once everything has been finalized.   CSW will follow up with Norton Pastel once arrived tomorrow and will collect all documents and place on chart.   CSW Plan/Description:  No Further Intervention Required/No Barriers to Discharge,Sudden Infant Death Syndrome (SIDS) Education,Perinatal Mood and Anxiety Disorder (PMADs) Education    Robb Matar, LCSWA 08/30/2020, 10:52 AM

## 2020-08-30 NOTE — Discharge Summary (Signed)
Postpartum Discharge Summary     Patient Name: Samantha Carroll DOB: 03/17/1996 MRN: [id number]  Date of admission: 08/30/2020 Delivery date:08/30/2020  Delivering provider: Sharyon Cable  Date of discharge: 08/31/2020  Admitting diagnosis: Encounter for elective induction of labor [Z34.90] Intrauterine pregnancy: [redacted]w[redacted]d     Secondary diagnosis:  Active Problems:   Adjustment disorder with depressed mood   History of nephrolithiasis   Normal labor   SVD (spontaneous vaginal delivery)   Encounter for initial prescription of implantable subdermal contraceptive  Additional problems: Baby placed up for adoption.  Discharge diagnosis: Term Pregnancy Delivered                                              Post partum procedures:postpartum Nexplanon Augmentation: N/A Complications: None  Hospital course: Onset of Labor With Vaginal Delivery      25 y.o. yo [id number] at [redacted]w[redacted]d was admitted in Active Labor on 08/30/2020. Patient had an uncomplicated labor course as follows:  Membrane Rupture Time/Date: 3:26 AM ,08/30/2020   Delivery Method:Vaginal, Spontaneous  Episiotomy: None  Lacerations:  1st degree  Patient had an uncomplicated postpartum course.  She is ambulating, tolerating a regular diet, passing flatus, and urinating well. Patient is discharged home in stable condition on 08/31/20.  Newborn Data: Birth date:08/30/2020  Birth time:3:38 AM  Gender:Female  Living status:Living  Apgars:8 ,9  Weight:2977 g   Magnesium Sulfate received: No BMZ received: No Rhophylac:N/A MMR:N/A T-DaP:Declined Flu: Declined Transfusion:No  Physical exam  Vitals:   08/30/20 1151 08/30/20 1608 08/30/20 2041 08/31/20 0523  BP: 112/62 117/82 109/60 122/68  Pulse: 68 63 62 69  Resp: 16 16 18    Temp: 98.2 F (36.8 C) 98.4 F (36.9 C) 98.7 F (37.1 C) 98.9 F (37.2 C)  TempSrc: Oral Oral Oral Oral  SpO2: 99% 100% 100% 97%  Weight:      Height:       General: alert, cooperative and no  distress Lochia: appropriate Uterine Fundus: firm Incision: N/A DVT Evaluation: No evidence of DVT seen on physical exam. No cords or calf tenderness. No significant calf/ankle edema. Labs: Lab Results  Component Value Date   WBC 10.9 (H) 08/30/2020   HGB 11.2 (L) 08/30/2020   HCT 34.5 (L) 08/30/2020   MCV 87.3 08/30/2020   PLT 274 08/30/2020   CMP Latest Ref Rng & Units 05/25/2020  Glucose 65 - 99 mg/dL 99  BUN 6 - 20 mg/dL 7  Creatinine 1.47 - 8.29 mg/dL 5.62(Z)  Sodium 308 - 657 mmol/L 135  Potassium 3.5 - 5.2 mmol/L 4.0  Chloride 96 - 106 mmol/L 104  CO2 20 - 29 mmol/L 20  Calcium 8.7 - 10.2 mg/dL 9.2  Total Protein 6.0 - 8.5 g/dL 5.9(L)  Total Bilirubin 0.0 - 1.2 mg/dL <8.4  Alkaline Phos 44 - 121 IU/L 71  AST 0 - 40 IU/L 10  ALT 0 - 32 IU/L 7   Edinburgh Score: Edinburgh Postnatal Depression Scale Screening Tool 08/30/2020  I have been able to laugh and see the funny side of things. (No Data)     After visit meds:  Allergies as of 08/31/2020      Reactions   Pineapple    Oral itching and swelling      Medication List    STOP taking these medications   Blood Pressure Kit Hardie Pulley  famotidine 20 MG tablet Commonly known as: Pepcid   ferrous sulfate 325 (65 FE) MG EC tablet   metoCLOPramide 10 MG tablet Commonly known as: REGLAN   multivitamin with minerals tablet   ondansetron 4 MG disintegrating tablet Commonly known as: Zofran ODT   scopolamine 1 MG/3DAYS Commonly known as: TRANSDERM-SCOP     TAKE these medications   acetaminophen 325 MG tablet Commonly known as: Tylenol Take 2 tablets (650 mg total) by mouth every 6 (six) hours as needed for mild pain, moderate pain, fever or headache.   cetirizine 10 MG tablet Commonly known as: ZyrTEC Allergy Take 1 tablet (10 mg total) by mouth daily.   escitalopram 10 MG tablet Commonly known as: LEXAPRO Take 1 tablet (10 mg total) by mouth daily.   fluticasone 50 MCG/ACT nasal spray Commonly known  as: Flonase Place 1 spray into both nostrils daily.   ibuprofen 600 MG tablet Commonly known as: ADVIL Take 1 tablet (600 mg total) by mouth every 6 (six) hours.   Prenate Mini 29-0.6-0.4-350 MG Caps Take 1 capsule by mouth daily before breakfast.        Discharge home in stable condition Infant Feeding: n/a (baby placed up for adoption) Infant Disposition: home with adoptive mother Discharge instruction: per After Visit Summary and Postpartum booklet. Activity: Advance as tolerated. Pelvic rest for 6 weeks.  Diet: routine diet Future Appointments: Future Appointments  Date Time Provider Department Center  09/13/2020 10:00 AM Gwyndolyn Saxon, LCSW [facility] None  09/19/2020  2:10 PM Shirlean Mylar, MD [facility]  09/29/2020 11:15 AM Gerrit Heck, CNM [facility] None   Follow up Visit:  Follow-up Information    Shirlean Mylar, MD. Go on 09/19/2020.   Specialty: Family Medicine Why: Please arrive for appointment at 1:50 PM for appointment at 2:10 PM Contact information: [address] [place] [address] [phone]                Please schedule this patient for a Virtual postpartum visit in 4 weeks with the following provider: Any provider. Additional Postpartum F/U:Postpartum Depression checkup  Low risk pregnancy complicated by: baby placed up for adoption Delivery mode:  Vaginal, Spontaneous  Anticipated Birth Control:  PP Nexplanon placed   Saketh Daubert, Skipper Cliche, MD OB Fellow, Faculty Practice 08/31/2020 10:06 AM

## 2020-08-30 NOTE — Anesthesia Procedure Notes (Signed)
Epidural Patient location during procedure: OB Start time: 08/30/2020 2:35 AM End time: 08/30/2020 2:45 AM  Staffing Anesthesiologist: Mellody Dance, MD Performed: anesthesiologist   Preanesthetic Checklist Completed: patient identified, IV checked, site marked, risks and benefits discussed, monitors and equipment checked, pre-op evaluation and timeout performed  Epidural Patient position: sitting Prep: DuraPrep Patient monitoring: heart rate, cardiac monitor, continuous pulse ox and blood pressure Approach: midline Location: L3-L4 Injection technique: LOR saline  Needle:  Needle type: Tuohy  Needle gauge: 17 G Needle length: 9 cm Needle insertion depth: 4 cm Catheter type: closed end flexible Catheter size: 20 Guage Catheter at skin depth: 9 cm Test dose: negative and Other  Assessment Events: blood not aspirated, injection not painful, no injection resistance and negative IV test  Additional Notes Informed consent obtained prior to proceeding including risk of failure, 1% risk of PDPH, risk of minor discomfort and bruising.  Discussed rare but serious complications including epidural abscess, permanent nerve injury, epidural hematoma.  Discussed alternatives to epidural analgesia and patient desires to proceed.  Timeout performed pre-procedure verifying patient name, procedure, and platelet count.  Patient tolerated procedure well. 1 attempt. Easy placement. Catheter threaded easily. Neg CSF/Heme. Samantha Furry, MD

## 2020-08-30 NOTE — Anesthesia Preprocedure Evaluation (Signed)
Anesthesia Evaluation  Patient identified by MRN, date of birth, ID band Patient awake    Reviewed: Allergy & Precautions, H&P , NPO status , Patient's Chart, lab work & pertinent test results  Airway Mallampati: II  TM Distance: >3 FB Neck ROM: Full    Dental no notable dental hx.    Pulmonary neg pulmonary ROS, former smoker,    Pulmonary exam normal breath sounds clear to auscultation       Cardiovascular negative cardio ROS Normal cardiovascular exam Rhythm:Regular Rate:Normal     Neuro/Psych negative neurological ROS  negative psych ROS   GI/Hepatic negative GI ROS, Neg liver ROS,   Endo/Other  negative endocrine ROS  Renal/GU Renal disease (kidney stones)  negative genitourinary   Musculoskeletal negative musculoskeletal ROS (+)   Abdominal   Peds negative pediatric ROS (+)  Hematology  (+) anemia ,   Anesthesia Other Findings   Reproductive/Obstetrics (+) Pregnancy                             Anesthesia Physical Anesthesia Plan  ASA: II  Anesthesia Plan: Epidural   Post-op Pain Management:    Induction:   PONV Risk Score and Plan: 2  Airway Management Planned:   Additional Equipment:   Intra-op Plan:   Post-operative Plan:   Informed Consent: I have reviewed the patients History and Physical, chart, labs and discussed the procedure including the risks, benefits and alternatives for the proposed anesthesia with the patient or authorized representative who has indicated his/her understanding and acceptance.       Plan Discussed with: Anesthesiologist  Anesthesia Plan Comments:         Anesthesia Quick Evaluation

## 2020-08-30 NOTE — Discharge Instructions (Signed)
You are being treated for depression with a SSRI called lexapro. Please take 1 tablet (10 mg total) once a day for 7 days, then take 2 tablets (20 mg total) once a day thereafter. If you have any side effects such as shaking hands, trouble sleeping, worsening anxiety, or thoughts of hurting yourself, please contact Dr. Thomes Lolling office at [phone] or one of the resources below in case of emergency.  You will have a 1 week follow up by phone with Dr. Leary Roca on 09/07/2020, and then an in person follow up at [facility] on 09/19/2020 at 2:10 PM.   You will also start therapy with the social worker at [place].   If you are feeling suicidal or depression symptoms worsen please immediately go to:   24 Hour Availability [facility]  [address] [place], [place] [phone] Crisis [phone]   If you are thinking about harming yourself or having thoughts of suicide, or if you know someone who is, seek help right away.  Call your doctor or mental health care provider.  Call 911 or go to a hospital emergency room to get immediate help, or ask a friend or family member to help you do these things.  Call the [place] National Suicide Prevention Lifelines toll-free, 24-hour hotline at 1-800-273-TALK 8721017977) or TTY: (331)190-6067 TTY [phone]) to talk to a trained counselor.  If you are in crisis, make sure you are not left alone.   If someone else is in crisis, make sure he or she is not left alone   [facility] [employer] (Domestic Violence, Rape & Victim Assistance [phone]  [facility]    (ONLY from 8am-4pm)    [phone]  [facility] Mobile Crisis Unit (24/7)   [phone]  [place] National Suicide Hotline   [phone] Len Childs)

## 2020-08-30 NOTE — ED Provider Notes (Signed)
12:31 AM 25 y/o G3P2 female, currently [redacted]w[redacted]d pregnant, presents to the ED for active labor. Began with labor around 64 tonight. Contractions currently every 3-5 minutes. Bloody show was around 2300 tonight. No other gush of fluid, per patient. GBS+ at prenatal visit. OBGYN care - [facility]  Screened with [facility] RN in triage. Currently 5-6cm dilated. Will transfer to [facility] L&D for continued care.   Antony Madura, PA-C 08/30/20 3403    Shon Baton, MD 08/31/20 [phone]

## 2020-08-31 ENCOUNTER — Other Ambulatory Visit (HOSPITAL_COMMUNITY): Payer: Medicaid Other

## 2020-08-31 ENCOUNTER — Encounter: Payer: Medicaid Other | Admitting: Obstetrics and Gynecology

## 2020-08-31 MED ORDER — ESCITALOPRAM OXALATE 10 MG PO TABS: 10.0000 mg | ORAL_TABLET | Freq: Every day | ORAL | 1 refills | Status: DC

## 2020-08-31 MED ORDER — IBUPROFEN 600 MG PO TABS: 600.0000 mg | ORAL_TABLET | Freq: Four times a day (QID) | ORAL | 0 refills | Status: AC

## 2020-08-31 MED ORDER — ACETAMINOPHEN 325 MG PO TABS: 650.0000 mg | ORAL_TABLET | Freq: Four times a day (QID) | ORAL | Status: AC | PRN

## 2020-09-02 ENCOUNTER — Inpatient Hospital Stay (HOSPITAL_COMMUNITY): Admission: AD | Admit: 2020-09-02 | Payer: Medicaid Other | Source: Home / Self Care | Admitting: Family Medicine

## 2020-09-02 ENCOUNTER — Inpatient Hospital Stay (HOSPITAL_COMMUNITY): Payer: Medicaid Other

## 2020-09-13 ENCOUNTER — Telehealth: Payer: Self-pay | Admitting: Licensed Clinical Social Worker

## 2020-09-13 ENCOUNTER — Encounter: Payer: Medicaid Other | Admitting: Licensed Clinical Social Worker

## 2020-09-13 NOTE — Telephone Encounter (Signed)
Called pt regarding schedule mychart visit. Unable to reach pt, left detailed voicemail regarding callback

## 2020-09-18 NOTE — Patient Instructions (Incomplete)
It was a pleasure to see you today!  1.    Be Well,  Dr. Jolleen Seman  

## 2020-09-18 NOTE — Progress Notes (Deleted)
    SUBJECTIVE:   CHIEF COMPLAINT / HPI: MDD f/u  Adjusment d/o w/ depressed mood: Patient recently gave birth on 08/30/20 and decided to pursue adoption. She has been diagnosed with depression and anxiety in the past, started counseling with LCSW at [facility], reports this is going ***. She has no history of BPD, no symptoms of mania, MDQ negative. PHQ-9 score of 10, no SI. Edinburgh score of 8 for anhedonia and feeling overwhelmed. GAD7 score of 7, indicating moderate anxiety symptoms. She reports moderate difficulty with these symptoms and wished to trial a medication. Started lexapro 10 mg, she reports ***. Inocente Salles today is ***.  PERTINENT  PMH / PSH: MDD  OBJECTIVE:   There were no vitals taken for this visit.  ***  ASSESSMENT/PLAN:   No problem-specific Assessment & Plan notes found for this encounter.     Shirlean Mylar, MD [facility]

## 2020-09-19 ENCOUNTER — Inpatient Hospital Stay: Payer: Medicaid Other | Admitting: Family Medicine

## 2020-09-27 ENCOUNTER — Encounter: Payer: Self-pay | Admitting: Family Medicine

## 2020-09-27 ENCOUNTER — Telehealth (INDEPENDENT_AMBULATORY_CARE_PROVIDER_SITE_OTHER): Payer: Medicaid Other | Admitting: Family Medicine

## 2020-09-27 ENCOUNTER — Encounter: Payer: Self-pay | Admitting: *Deleted

## 2020-09-27 ENCOUNTER — Other Ambulatory Visit: Payer: Self-pay

## 2020-09-27 DIAGNOSIS — Z7289 Other problems related to lifestyle: Secondary | ICD-10-CM

## 2020-09-27 DIAGNOSIS — O99345 Other mental disorders complicating the puerperium: Secondary | ICD-10-CM | POA: Diagnosis present

## 2020-09-27 DIAGNOSIS — F53 Postpartum depression: Secondary | ICD-10-CM | POA: Diagnosis not present

## 2020-09-27 DIAGNOSIS — Z789 Other specified health status: Secondary | ICD-10-CM

## 2020-09-27 MED ORDER — SERTRALINE HCL 25 MG PO TABS
25.0000 mg | ORAL_TABLET | Freq: Every day | ORAL | 0 refills | Status: DC
Start: 2020-09-27 — End: 2020-10-20

## 2020-09-27 NOTE — Assessment & Plan Note (Signed)
Edinburgh score increased from 8 to 19, answer to question 10 is zero. Patient with no SI/HI/VAH. Recommend treatment with both medication and therapy, will start zoloft. As patient was quite sensitive to lexapro 10 mg, will start with zoloft 25 mg and increase to 50 mg after 7 days. Discussed common SE's, importance of adherence to medication and adequate trial length of 4-6 weeks. Will follow up with phone call in 1 week, then in person visit in 4 weeks (first available). Will also send message to [facility] to help patient re-connect with therapy. Encouraged her to call [facility] as well. Crisis resources and RTC precautions sent via mychart to patient. Precepted with Dr. Shawnee Knapp.

## 2020-09-27 NOTE — Assessment & Plan Note (Signed)
Patient reports increased EtOH use associated with postpartum depression. AUDIT-C score quite high at 9. She has never had withdrawal sx before, not having them now. Discussed no recommended safe EtOH usage and effect of depressants on worsening depression and compromising medication trials. Patient reports that she thinks she can stop drinking at-will at this point. Counseled on options if she finds she cannot do that. Continue to monitor.

## 2020-09-27 NOTE — Progress Notes (Signed)
[facility] Telemedicine Visit  Patient consented to have virtual visit and was identified by name and date of birth. Method of visit: Video  Encounter participants: Patient: Samantha Carroll - located at home Provider: Shirlean Mylar - located at home  Chief Complaint: f/u hospital visit  HPI:  [name] is a 25yo woman who recently gave birth 4 weeks ago. She went through adoption process and has been having increasing symptoms of PPD. See hospital notes regarding exam and screening at that time. She reports that she has had increasing trouble sleeping, intrusive thoughts, anhedonia, low mood, tearfulness, etc. She reports that she tried the lexapro for one week and stopped taking it because she "felt like a zombie." She reports that she has been using alcohol to cope this month, drinking 1-4 glasses of EtOH per night to numb overwhelming feelings. She denies any SI/HI/VAH. She was seeing LCSW at [facility], but they had difficulty connecting virtually at last appointment and she has not had a follow up since. In terms of EtOH- patient reports that she has been using this as a coping mechanism in the short term and she prefers not to drink, she believes she can stop drinking at will today. Discussed importance of not adding a depressant to depression as this will make symptoms worse in the long run and make medication trials less effective. She wishes to try another medication. After shared decision making, zoloft selected as next agent.   Edinburgh Postnatal Depression Scale Screening Tool 09/27/2020 08/31/2020 08/30/2020  I have been able to laugh and see the funny side of things. 1 1 (No Data)  I have looked forward with enjoyment to things. 3 0 -  I have blamed myself unnecessarily when things went wrong. 3 2 -  I have been anxious or worried for no good reason. 3 2 -  I have felt scared or panicky for no good reason. 0 0 -  Things have been getting on top of me.  2 1 -  I have been so unhappy that I have had difficulty sleeping. 2 0 -  I have felt sad or miserable. 2 1 -  I have been so unhappy that I have been crying. 3 1 -  The thought of harming myself has occurred to me. 0 0 -  Edinburgh Postnatal Depression Scale Total 19 8 -      ROS: per HPI  Pertinent PMHx: postpartum, h/o MDD/GAD, adoption  Exam:  There were no vitals taken for this visit.  Respiratory: speaking in full sentences  Assessment/Plan:  Postpartum depression Edinburgh score increased from 8 to 19, answer to question 10 is zero. Patient with no SI/HI/VAH. Recommend treatment with both medication and therapy, will start zoloft. As patient was quite sensitive to lexapro 10 mg, will start with zoloft 25 mg and increase to 50 mg after 7 days. Discussed common SE's, importance of adherence to medication and adequate trial length of 4-6 weeks. Will follow up with phone call in 1 week, then in person visit in 4 weeks (first available). Will also send message to [facility] to help patient re-connect with therapy. Encouraged her to call [facility] as well. Crisis resources and RTC precautions sent via mychart to patient. Precepted with Dr. Shawnee Knapp.   Alcohol use Patient reports increased EtOH use associated with postpartum depression. AUDIT-C score quite high at 9. She has never had withdrawal sx before, not having them now. Discussed no recommended safe EtOH usage and effect of  depressants on worsening depression and compromising medication trials. Patient reports that she thinks she can stop drinking at-will at this point. Counseled on options if she finds she cannot do that. Continue to monitor.    Time spent during visit with patient: 17 minutes

## 2020-09-29 ENCOUNTER — Encounter: Payer: Self-pay | Admitting: Family Medicine

## 2020-09-29 ENCOUNTER — Telehealth (INDEPENDENT_AMBULATORY_CARE_PROVIDER_SITE_OTHER): Payer: Medicaid Other | Admitting: Family Medicine

## 2020-09-29 DIAGNOSIS — Z8759 Personal history of other complications of pregnancy, childbirth and the puerperium: Secondary | ICD-10-CM

## 2020-09-29 DIAGNOSIS — O99345 Other mental disorders complicating the puerperium: Secondary | ICD-10-CM

## 2020-09-29 DIAGNOSIS — F53 Postpartum depression: Secondary | ICD-10-CM

## 2020-09-29 NOTE — Progress Notes (Addendum)
Provider location: Center for [employer] [facility] [name] [facility]   I connected with [name] on 09/29/2020 by Mychart Video at home and verified that I am speaking with the correct person using two identifiers. Patient is located at home.    I discussed the limitations, risks, security and privacy concerns of performing an evaluation and management service by video and the availability of in person appointments. I also discussed with the patient that there may be a patient responsible charge related to this service. The patient expressed understanding and agreed to proceed.  Post Partum Visit Note  Samantha Carroll is a 25 y.o. [phone] female who presents for a postpartum visit. She is 4 weeks postpartum following a normal spontaneous vaginal delivery.  I have fully reviewed the prenatal and intrapartum course. The delivery was at  gestational weeks 39.4.  Anesthesia: Epiudal. Postpartum course has been uncomplicated. Baby is doing well. Baby is feeding by bottle. Bleeding yes. Bowel function is normal. Bladder function is normal. Patient is not sexually active. Contraception method is negative. Postpartum depression screening: positive   The pregnancy intention screening data noted above was reviewed. Potential methods of contraception were discussed. The patient elected to proceed with nexplanon, placed at hospital.    Inocente Salles Postnatal Depression Scale - 09/29/20 0847      Edinburgh Postnatal Depression Scale:  In the Past 7 Days   I have been able to laugh and see the funny side of things. 2    I have looked forward with enjoyment to things. 0    I have blamed myself unnecessarily when things went wrong. 2    I have been anxious or worried for no good reason. 0    I have felt scared or panicky for no good reason. 0    Things have been getting on top of me. 2    I have been so unhappy that I have had difficulty sleeping. 3    I have felt sad or miserable. 2    I  have been so unhappy that I have been crying. 1    The thought of harming myself has occurred to me. 0    Edinburgh Postnatal Depression Scale Total 12          The following portions of the patient's history were reviewed and updated as appropriate: allergies, current medications, past family history, past medical history, past social history, past surgical history and problem list.  Review of Systems Pertinent items noted in HPI and remainder of comprehensive ROS otherwise negative.    Objective:  There were no vitals taken for this visit.   General:  alert, cooperative and appears stated age  Lungs: comfortalbe on room air        Assessment:    Normal postpartum exam. Pap smear not done at today's visit.   Plan:   Essential components of care per ACOG recommendations:  1.  Mood and well being: Patient with positive depression screening today. Reviewed local resources for support.  Has appt w Sue Lush next week.  2. Infant care and feeding:  BUFA. Still lactating a bit but not uncomfortable. Discussed strategies for relieving discomfort.  3. Sexuality, contraception and birth spacing - Patient does not want a pregnancy in the next year. - s/p Nexplanon at hospital  4. Sleep and fatigue -Encouraged family/partner/community support of 4 hrs of uninterrupted sleep to help with mood and fatigue  5. Physical Recovery  - Discussed patients delivery and complications -  Patient had a 1st degree laceration, perineal healing reviewed. Patient expressed understanding - Patient has urinary incontinence? No - Patient is safe to resume physical and sexual activity - requests letter to return to work on 10/10/2020, will send through mail  6.  Health Maintenance - Last pap smear done 03/30/2020 and was normal  - not due for mammogram   Venora Maples, MD Center for [facility], [facility]

## 2020-10-04 ENCOUNTER — Ambulatory Visit: Payer: Medicaid Other | Admitting: Licensed Clinical Social Worker

## 2020-10-05 ENCOUNTER — Telehealth: Payer: Self-pay | Admitting: Family Medicine

## 2020-10-05 NOTE — Telephone Encounter (Signed)
Called patient to check in re: postpartum depression. Left HIPAA compliant VM. If patient calls back, specifically want to know if she is taking zoloft, what dose, and how she is feeling.   Shirlean Mylar, MD [facility] Family Medicine Residency, PGY-2

## 2020-10-19 ENCOUNTER — Encounter: Payer: Self-pay | Admitting: Family Medicine

## 2020-10-19 DIAGNOSIS — O99345 Other mental disorders complicating the puerperium: Secondary | ICD-10-CM

## 2020-10-19 DIAGNOSIS — F53 Postpartum depression: Secondary | ICD-10-CM

## 2020-10-20 IMAGING — US US OB COMP LESS 14 WK
1 series · 15 of 18 positions shown · non-contrast
Comparison: None.

CLINICAL DATA: Pregnant patient in early first trimester pregnancy
with lower abdominal pain for 1 week.

EXAM:
OBSTETRIC <14 WK ULTRASOUND
TECHNIQUE: Transabdominal ultrasound was performed for evaluation of the
gestation as well as the maternal uterus and adnexal regions.

[Series 1: us ob comp less 14 wk · 18 acquisitions, 15 frames shown]
[im 1/18]
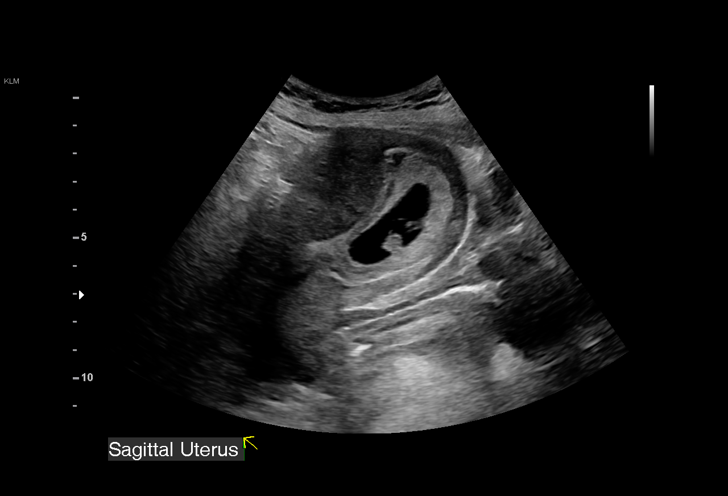
[im 2/18]
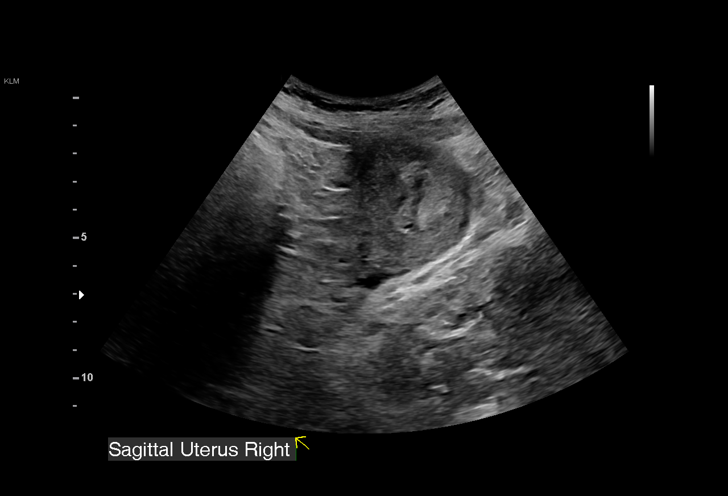
[im 4/18]
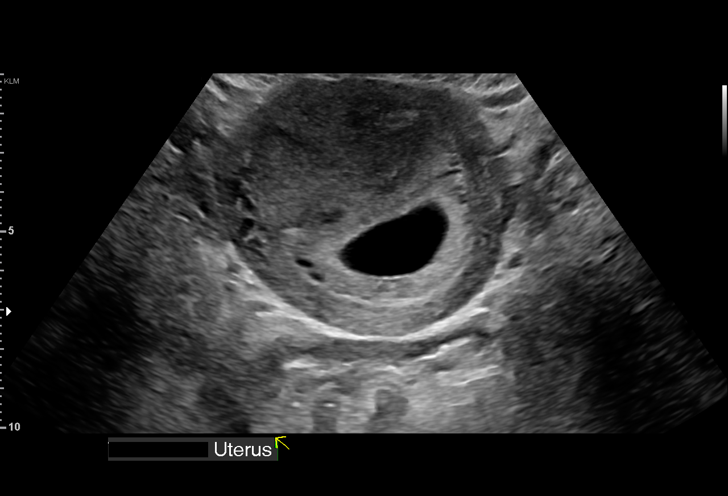
[im 5/18]
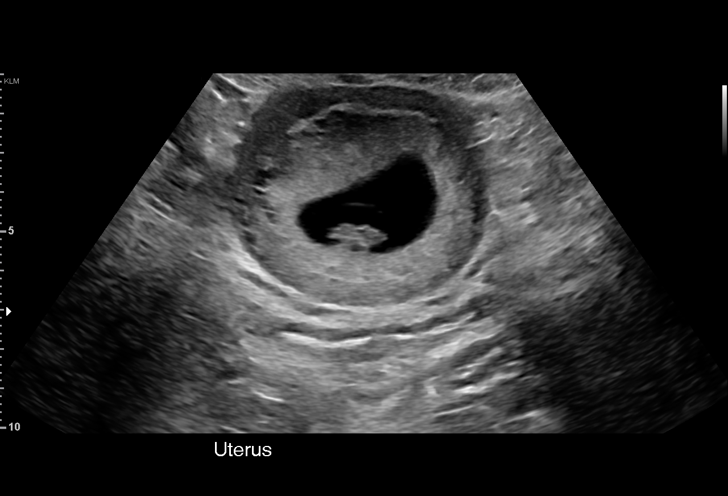
[im 6/18]
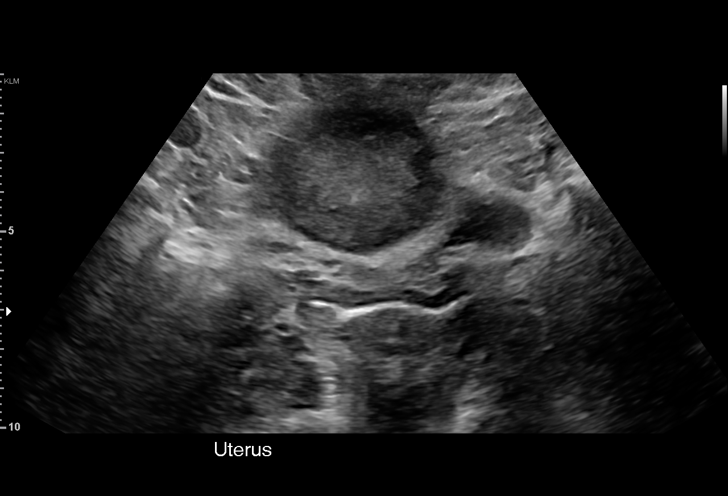
[im 7/18]
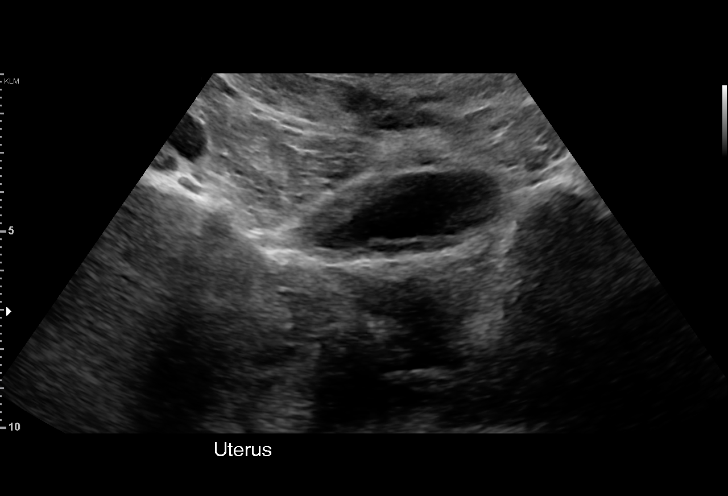
[im 8/18]
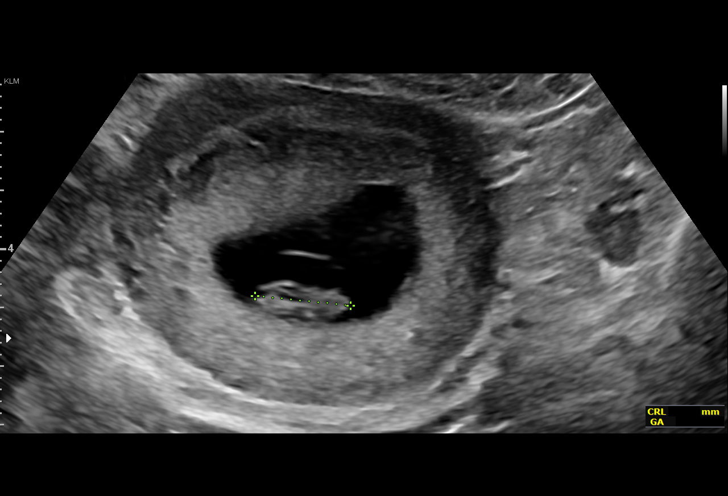
[im 10/18]
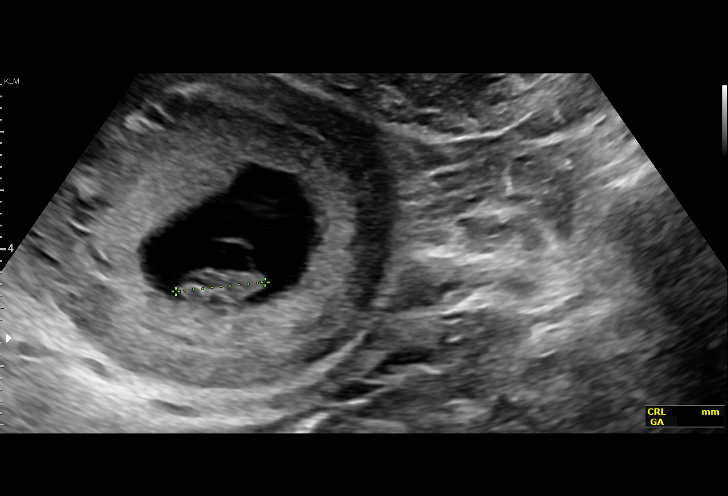
[im 11/18]
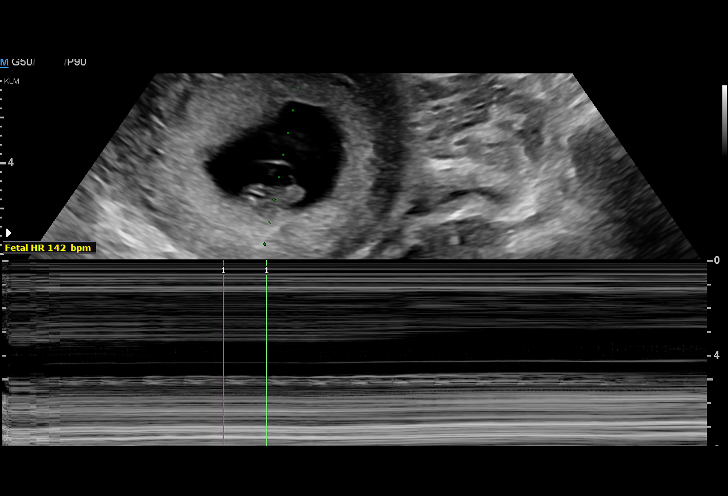
[im 12/18]
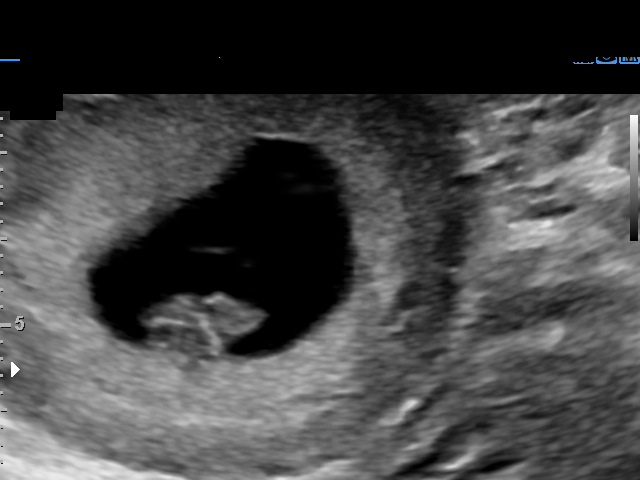
[im 13/18]
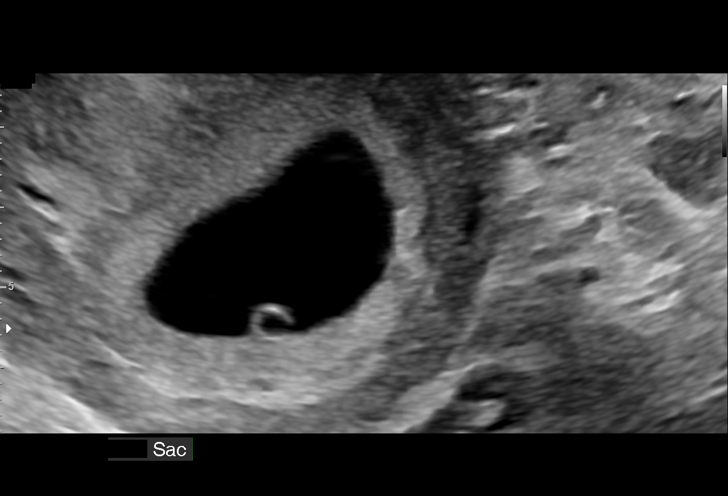
[im 14/18]
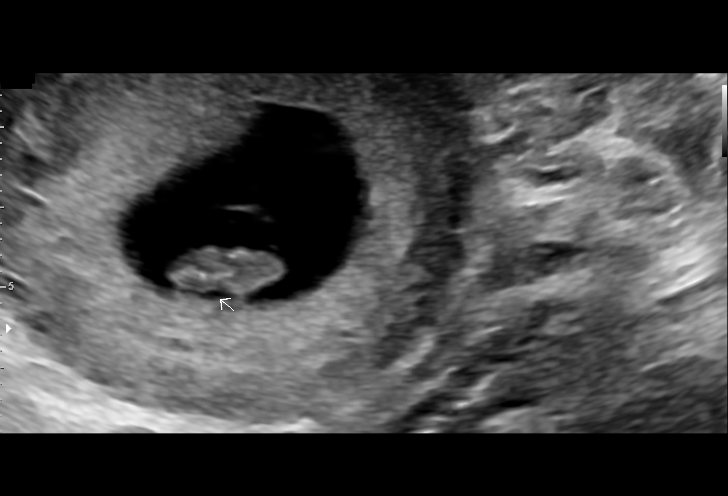
[im 16/18]
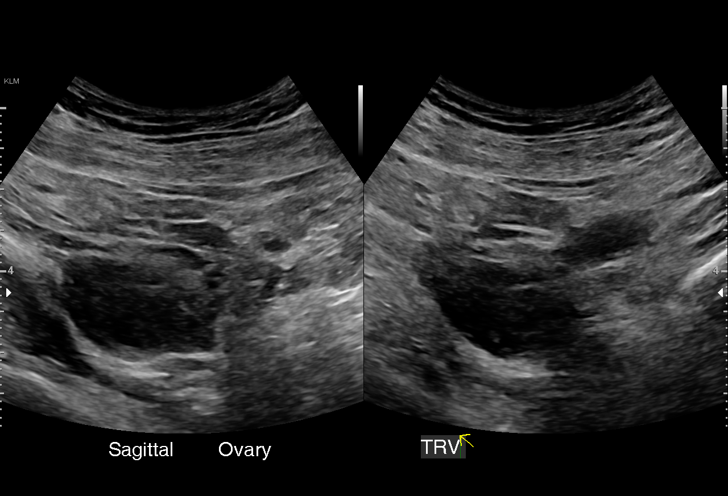
[im 17/18]
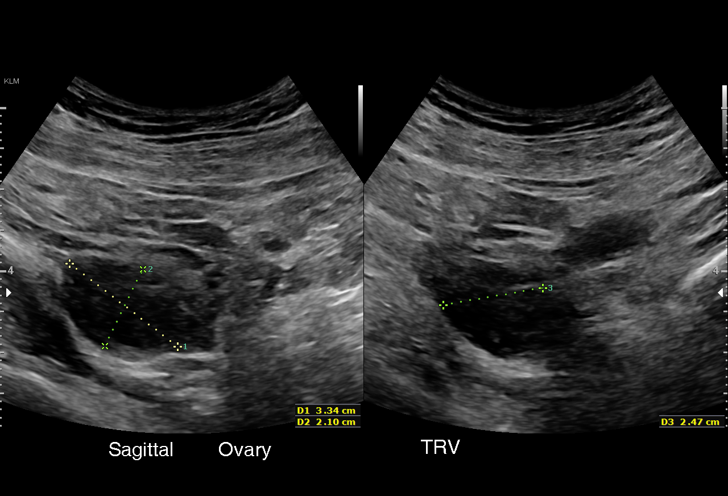
[im 18/18]
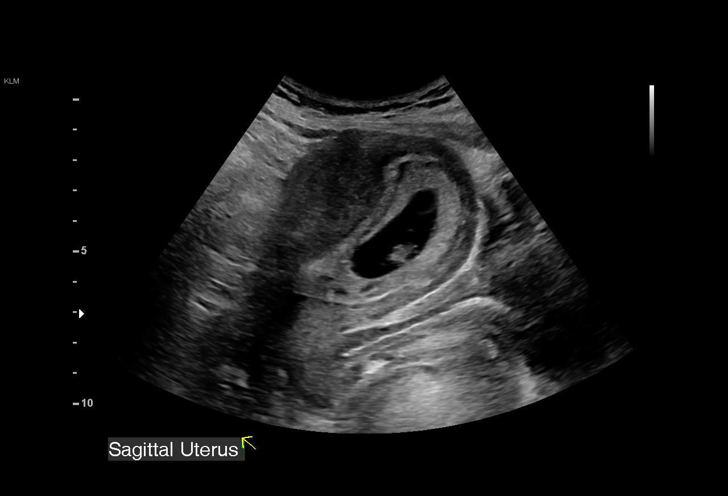

[15 of 18 positions shown; findings below may reference images not displayed]

FINDINGS: Intrauterine gestational sac: Single

Yolk sac:  Visualized.

Embryo:  Visualized.

Cardiac Activity: Visualized.

Heart Rate: 142 bpm

CRL:   15.43 mm   7 w 6 d                  US EDC: 09/02/2020

Subchorionic hemorrhage:  None visualized.

Maternal uterus/adnexae: Both ovaries are visualized and are normal.
No adnexal mass or pelvic free fluid.
IMPRESSION: Single live intrauterine pregnancy estimated gestational age 7 weeks
6 days based on crown-rump length for ultrasound EDC 09/02/2020. No
subchorionic hemorrhage.

## 2020-10-20 MED ORDER — SERTRALINE HCL 50 MG PO TABS
50.0000 mg | ORAL_TABLET | Freq: Every day | ORAL | 3 refills | Status: DC
Start: 2020-10-20 — End: 2020-11-03

## 2020-10-26 ENCOUNTER — Ambulatory Visit (INDEPENDENT_AMBULATORY_CARE_PROVIDER_SITE_OTHER): Payer: Medicaid Other | Admitting: Family Medicine

## 2020-10-26 ENCOUNTER — Encounter: Payer: Self-pay | Admitting: Family Medicine

## 2020-10-26 ENCOUNTER — Other Ambulatory Visit: Payer: Self-pay

## 2020-10-26 DIAGNOSIS — O99345 Other mental disorders complicating the puerperium: Secondary | ICD-10-CM | POA: Diagnosis present

## 2020-10-26 DIAGNOSIS — F53 Postpartum depression: Secondary | ICD-10-CM

## 2020-10-26 NOTE — Patient Instructions (Addendum)
It was a pleasure to see you today!  1. Please increase sertraline to 100 mg (2 pills) daily. I will call you in 1 week to check in.  2. Please call to schedule a therapy and counseling appointment.  [facility]Psychiatry and Counseling [address] [place], [place] [address] [phone]  3. If you have any overwhelming intrusive thoughts and think you may hurt yourself, please tell someone and go to the Behavioral Health Urgent Care center below, they are open 24/7.  4. Follow up on 11/25/20 at 8:50 AM  If you are feeling suicidal or depression symptoms worsen please immediately go to:   24 Hour Availability [facility]  [address] [place], [place] [phone] Crisis [phone]    . If you are thinking about harming yourself or having thoughts of suicide, or if you know someone who is, seek help right away. . Call your doctor or mental health care provider. . Call 911 or go to a hospital emergency room to get immediate help, or ask a friend or family member to help you do these things. . Call the [place] National Suicide Prevention Lifeline's toll-free, 24-hour hotline at 1-800-273-TALK 847-437-9034) or TTY: 1-800-799-4 TTY 914 174 6149) to talk to a trained counselor. . If you are in crisis, make sure you are not left alone.  . If someone else is in crisis, make sure he or she is not left alone   [facility] of the [employer] (Domestic Violence, Rape & Victim Assistance [phone]   [facility] [employer] Crisis [facility]    (ONLY from 8am-4pm)    [phone]  Therapeutic Alternative Mobile Crisis Unit (24/7)   [phone]  [place] National Suicide Hotline   681-464-0453 Len Childs)   Therapy and Counseling Resources Most providers on this list will take Medicaid. Patients with commercial insurance or Medicare should contact their insurance company to get a list of in network providers.  [facility]:Psychiatry and  Counseling [address] [place], [place] [address] [phone]  [facility]  [address] Suite [place], [place] [address]      [phone]  [facility] [address]  [place], [place] [address] [phone]  [facility] [address]  [place], [place] [address]       [phone]      Jovita Kussmaul [facility] [address], [place], [place] [phone]  [facility]:  [address] [place] [phone]  [facility]:  [address] [place] [phone]  [facility] (under & uninsured) [address]   [place] [phone]    [email or web address]    [facility] [address] . [place]    [phone]  [facility] -[address]     Phone:  [phone]     [facility]-  [address]  [phone]   [facility] [address]      [place], [place] [phone]  [facility]: [address] [place], [place], [place]  [phone]   [facility] (Spanish therapist) [email or web address].[email or web address]/  [address] [place]  [address]   [place] [address]    [phone]    [facility]   [address],  [place], [place]  [phone]   [facility]  [address] [place]  [phone]  [facility]  [address] [place], [place]        [phone]  Open Access/Walk In Clinic under & uninsured  [facility]  [address] [place], [place] [phone] Crisis [phone]  Family Service of the [place],  (Spanish)   [address], [place]: [phone]) 8:30 - 12; 1 - 2:30  Family Service of the [employer],  [address] [place], [place]    ([phone]):8:30 - 12; 2 - 3PM  [facility] [place],   [address]  [place]; [phone]):   Mon - Fri 8 AM - 5 PM  [facility] [address] [place]  MWF 12:30 to 3:00 or call to schedule an appointment  [phone]  Specific Provider options Psychology Today  [email or web address].[email or web address]/us 1. click on find a therapist  2. enter your zip code 3. left side and select or tailor a therapist for your specific need.   [facility] Provider Directory [email or web address]  (Medicaid)   Follow all drop down to find a provider  Social Support program [facility] [place] [phone] or PhotoS[employer].pl [address], [place], [place] Recovery support and educational   24- Hour Availability:  .  [facility]  . [address] [place], [place] [employer] [phone] Crisis [phone]  . Family Service of the [employer] [phone]  [facility] Crisis Service  [phone]   . [facility] [employer]  [phone] (after hours)  . Therapeutic Alternative/Mobile Crisis   [phone]  . [place] National Suicide Hotline  234-667-8746 (TALK)  . Call 911 or go to emergency room  . [employer]  [phone]);  [place] and [employer]   . [facility]  [phone]); [place], [place], [place], [place], [place], [place], [place]

## 2020-10-26 NOTE — Progress Notes (Signed)
    SUBJECTIVE:   CHIEF COMPLAINT / HPI:   PPD: Patient is taking sertraline 50 mg daily and reports that she had been doing well until last week when she was watching a show and a character had her daughter's name. This caused her to become angry and overwhelmed. She reports smashing glass, which helped relieve those feelings. She did not hurt herself or anyone else. She also reports that she went hiking with a friend last week and when they reached the peak of the climb, she had intrusive thoughts about jumping off the ledge. She reports that she was disturbed by these thoughts, she told her friend (whom she was with) about them, and did not act on these thoughts. She later also spoke to her aunt about this, her aunt is a big part of her support system. She attempted suicide 6 years ago at the age of 32 with a tylenol overdose. She reports that she has not had any other thoughts or actions of self harm, finds these thoughts disturbing, and does not have any plans to harm herself. She saw a therapist at [facility], but there was a problem with the follow up appt. She would like referral to other therapy options.  PERTINENT  PMH / PSH: PPD  OBJECTIVE:   Pulse 85   Ht 5\' 8"  (1.727 m)   Wt 175 lb 9.6 oz (79.7 kg)   SpO2 97%   BMI 26.70 kg/m   Nursing note and vitals reviewed GEN: age-appropriate, AAW, resting comfortably in chair, NAD, WNWD HEENT: NCAT. Sclera without injection or icterus. Neuro: AOx3  Ext: no edema Psych: Pleasant and appropriate, full affect, makes eye contact, fluent speech  Edinburgh Postnatal Depression Scale - 10/26/20 1700      Edinburgh Postnatal Depression Scale:  In the Past 7 Days   I have been able to laugh and see the funny side of things. 1    I have looked forward with enjoyment to things. 2    I have blamed myself unnecessarily when things went wrong. 3    I have been anxious or worried for no good reason. 3    I have felt scared or panicky for no good  reason. 1    Things have been getting on top of me. 3    I have been so unhappy that I have had difficulty sleeping. 0    I have felt sad or miserable. 3    I have been so unhappy that I have been crying. 3    The thought of harming myself has occurred to me. 2    Edinburgh Postnatal Depression Scale Total 21          ASSESSMENT/PLAN:   Postpartum depression Edinburgh score has increased to 21, patient has had passive thoughts of SI. Counseled on crisis planning to go to [facility], call 911, friends/family for help. Patient agrees to seek assistance of continued thoughts of self harm occur, especially if any plans develop. Increased sertraline to 100 mg daily. Called patient's aunt and we discussed passive SI and crisis plan. Follow up by phone in 1 week, then in person in 1 month. SI and therapy resources given.     [facility], MD [facility]

## 2020-10-30 NOTE — Assessment & Plan Note (Addendum)
Edinburgh score has increased to 21, patient has had passive thoughts of SI. Counseled on crisis planning to go to [facility], call 911, friends/family for help. Patient agrees to seek assistance of continued thoughts of self harm occur, especially if any plans develop. Increased sertraline to 100 mg daily. Called patient's aunt and we discussed passive SI and crisis plan. Follow up by phone in 1 week, then in person in 1 month. SI and therapy resources given. Precepted with Drs. Pray and Shawnee Knapp.

## 2020-11-03 ENCOUNTER — Other Ambulatory Visit: Payer: Self-pay | Admitting: Family Medicine

## 2020-11-03 ENCOUNTER — Telehealth: Payer: Self-pay | Admitting: Family Medicine

## 2020-11-03 ENCOUNTER — Encounter: Payer: Self-pay | Admitting: Family Medicine

## 2020-11-03 DIAGNOSIS — F53 Postpartum depression: Secondary | ICD-10-CM

## 2020-11-03 MED ORDER — SERTRALINE HCL 100 MG PO TABS: 100.0000 mg | ORAL_TABLET | Freq: Every day | ORAL | 2 refills | Status: DC

## 2020-11-03 NOTE — Telephone Encounter (Signed)
Called patient to check in on MDD and medication after appointment 1 week ago. Left HIPAA compliant VM. If patient returns call, mostly want to know how she is feeling.  Shirlean Mylar, MD [facility] Family Medicine Residency, PGY-2

## 2020-11-24 ENCOUNTER — Encounter: Payer: Self-pay | Admitting: Family Medicine

## 2020-11-24 NOTE — Progress Notes (Signed)
Called by CMA x3 with no answer. I called x2 with no answer. Will check in next week/send my chart.  Shirlean Mylar, MD [facility] Res[facility], PGY-2

## 2020-11-25 ENCOUNTER — Telehealth (INDEPENDENT_AMBULATORY_CARE_PROVIDER_SITE_OTHER): Payer: Medicaid Other | Admitting: Family Medicine

## 2020-11-25 ENCOUNTER — Other Ambulatory Visit: Payer: Self-pay

## 2020-11-25 DIAGNOSIS — O99345 Other mental disorders complicating the puerperium: Secondary | ICD-10-CM

## 2020-11-25 DIAGNOSIS — F53 Postpartum depression: Secondary | ICD-10-CM

## 2020-12-22 ENCOUNTER — Encounter: Payer: Self-pay | Admitting: Family Medicine

## 2020-12-22 DIAGNOSIS — O99345 Other mental disorders complicating the puerperium: Secondary | ICD-10-CM

## 2020-12-22 DIAGNOSIS — F53 Postpartum depression: Secondary | ICD-10-CM

## 2020-12-22 MED ORDER — SERTRALINE HCL 100 MG PO TABS: 100.0000 mg | ORAL_TABLET | Freq: Every day | ORAL | 2 refills | Status: AC

## 2021-01-09 ENCOUNTER — Encounter: Payer: Self-pay | Admitting: Family Medicine

## 2021-01-13 IMAGING — US US RENAL
1 series · 15 of 25 positions shown · non-contrast
Comparison: None.

CLINICAL DATA: Right flank pain with hematuria, 20 weeks pregnant

EXAM:
RENAL / URINARY TRACT ULTRASOUND COMPLETE

[Series 1: us renal · 15 of 30 slices shown]
[im 1/30]
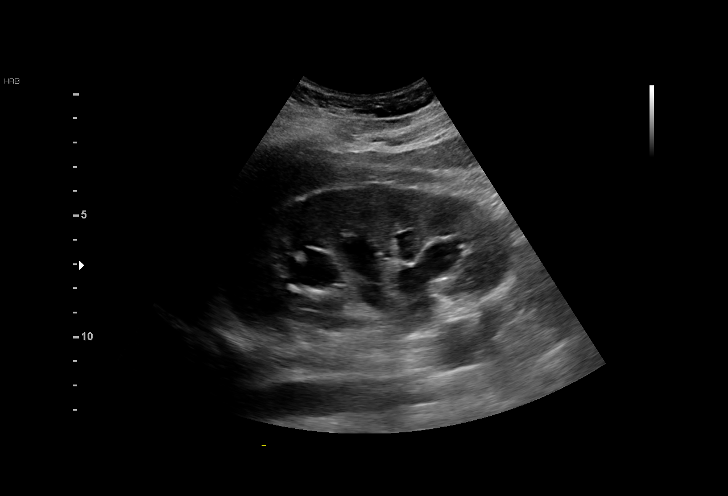
[im 3/30]
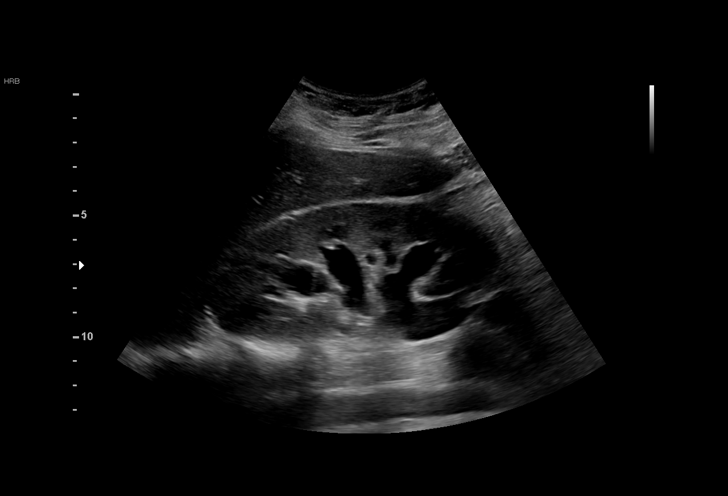
[im 5/30]
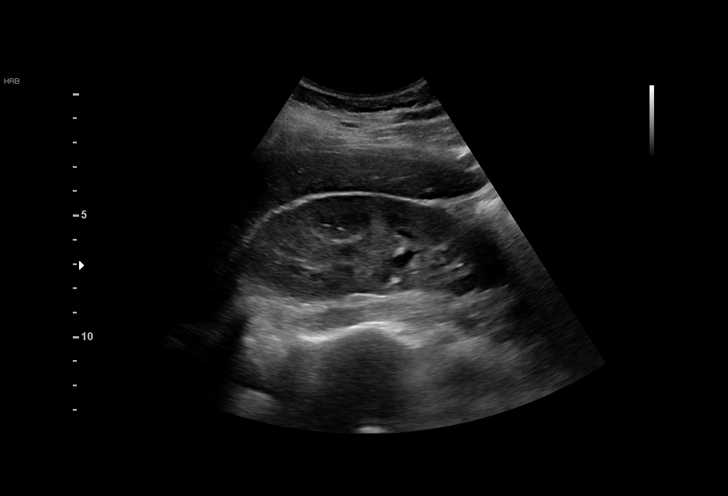
[im 7/30]
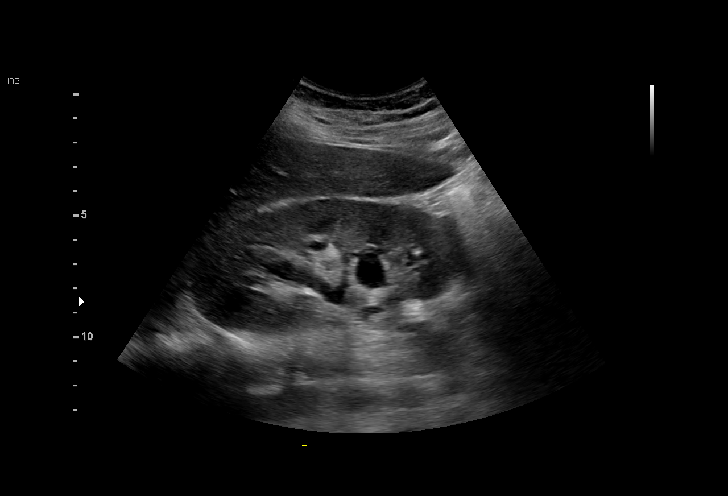
[im 9/30]
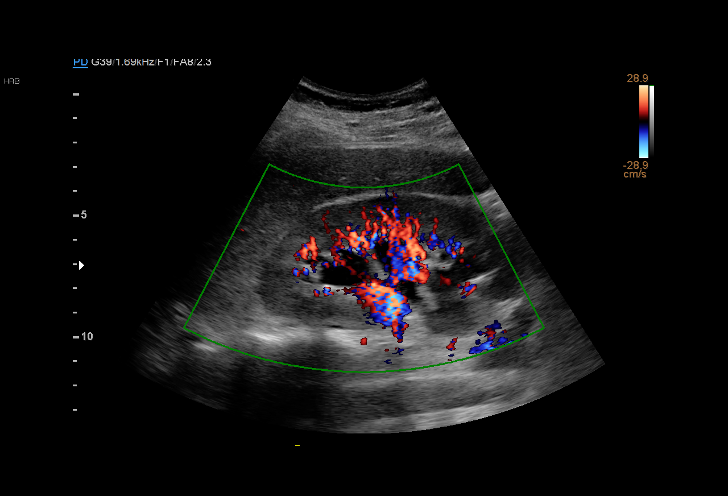
[im 11/30]
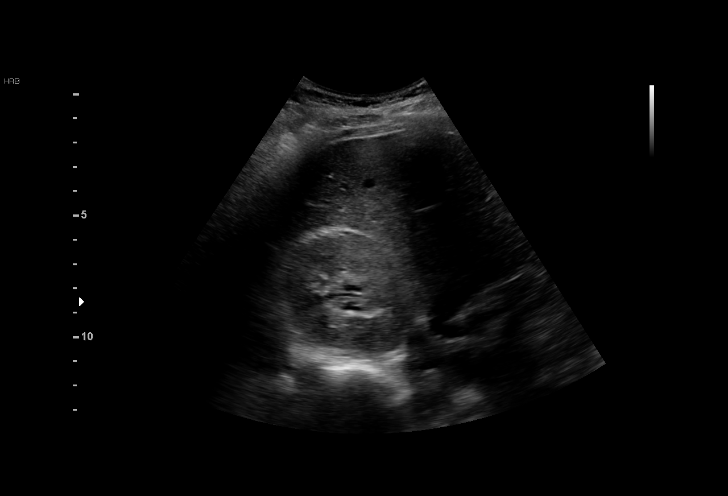
[im 13/30]
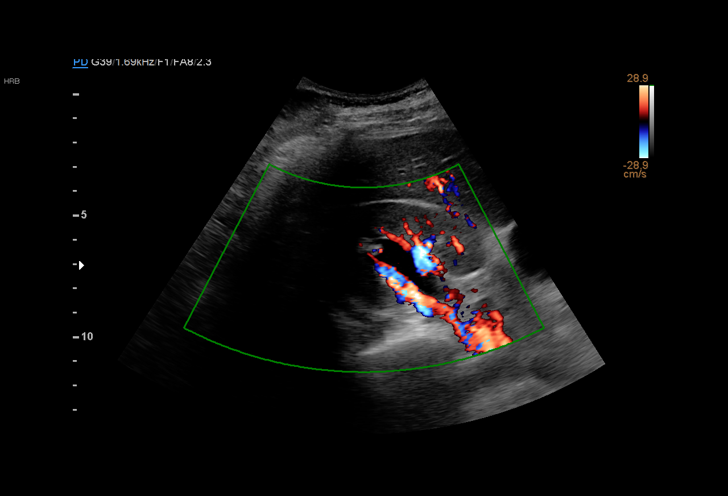
[im 15/30]
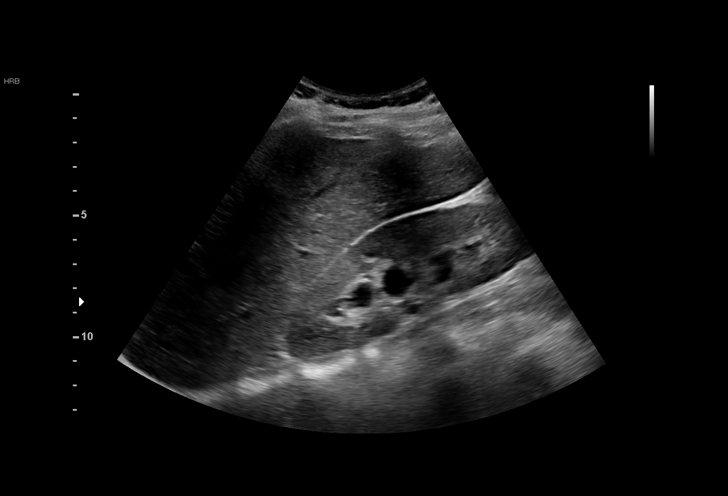
[im 17/30]
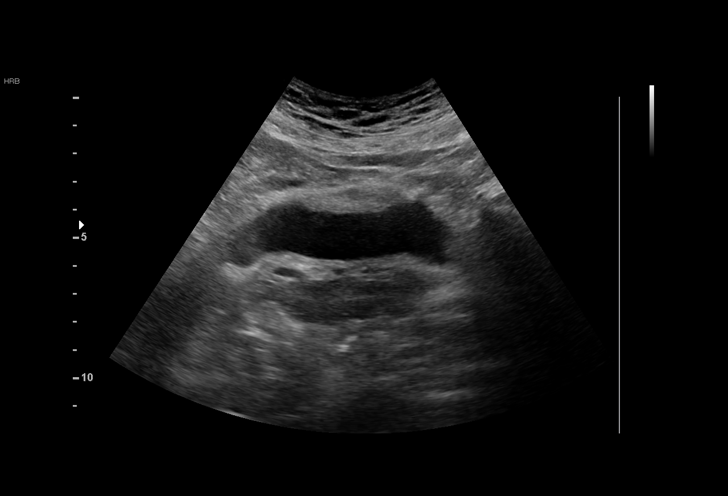
[im 19/30]
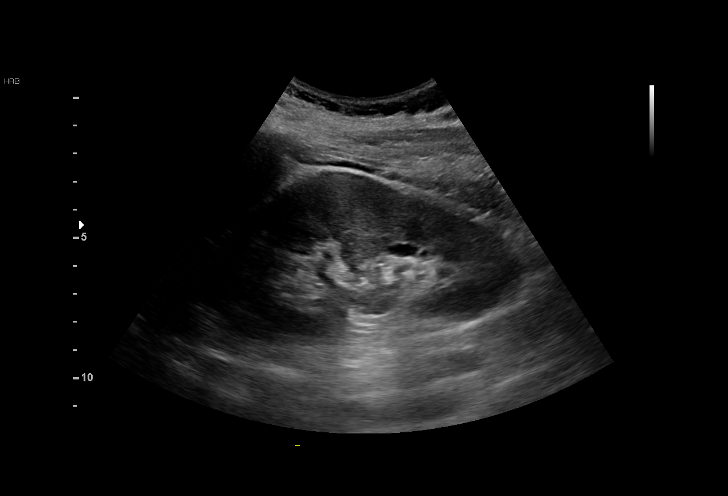
[im 21/30]
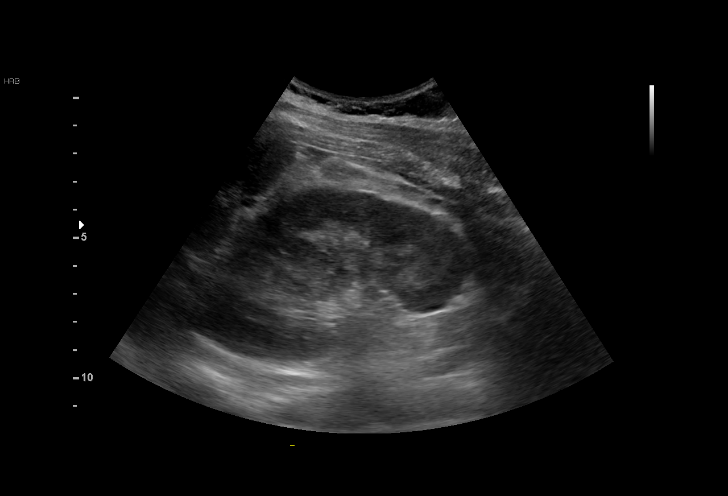
[im 23/30]
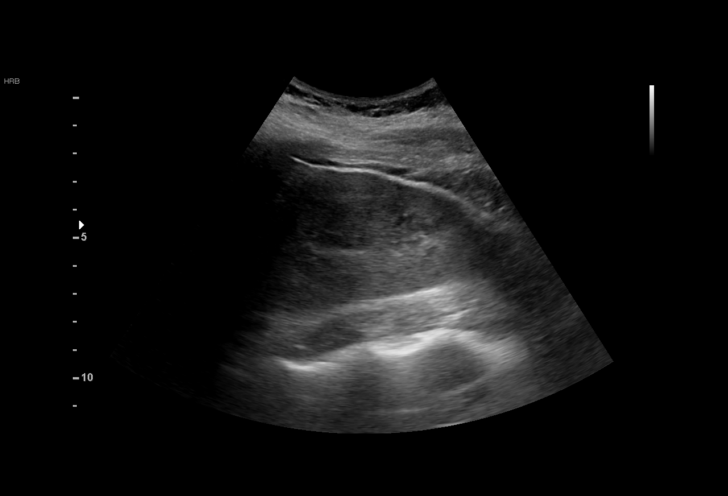
[im 25/30]
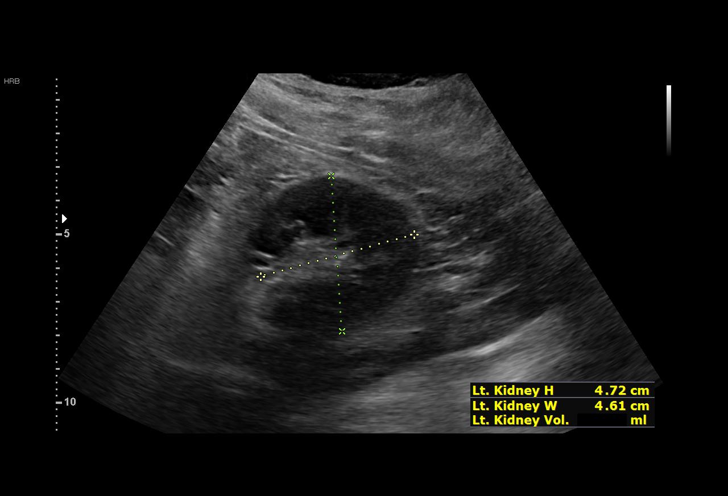
[im 27/30]
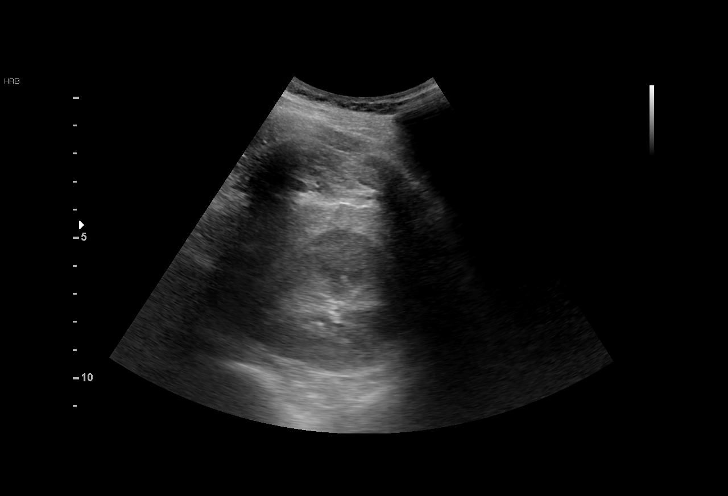
[im 30/30]
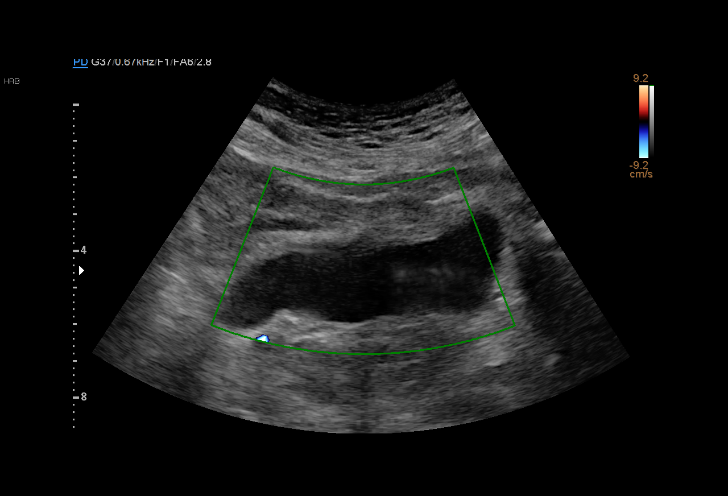

[15 of 25 positions shown; findings below may reference images not displayed]

FINDINGS: Right Kidney:

Renal measurements: 12 x 4.7 x 6 cm = volume: 176.1 mL. Cortical
echogenicity is normal. No mass. Mild to moderate right
hydronephrosis and proximal hydroureter.

Left Kidney:

Renal measurements: 11.1 x 4.7 x 4.6 cm = volume: 126.6 mL.
Echogenicity within normal limits. No mass or hydronephrosis
visualized.

Bladder:

Appears normal for degree of bladder distention.

Other:

None.
IMPRESSION: 1. Mild to moderate right hydronephrosis and proximal hydroureter.
2. Normal ultrasound appearance of the left kidney

## 2021-01-18 ENCOUNTER — Other Ambulatory Visit: Payer: Self-pay

## 2021-01-18 ENCOUNTER — Emergency Department (HOSPITAL_COMMUNITY)
Admission: EM | Admit: 2021-01-18 | Discharge: 2021-01-18 | Disposition: A | Discharge status: Home / Self Care | Payer: Medicaid Other | Attending: Emergency Medicine | Admitting: Emergency Medicine

## 2021-01-18 DIAGNOSIS — Y93G1 Activity, food preparation and clean up: Secondary | ICD-10-CM | POA: Diagnosis not present

## 2021-01-18 DIAGNOSIS — S6992XA Unspecified injury of left wrist, hand and finger(s), initial encounter: Secondary | ICD-10-CM | POA: Diagnosis present

## 2021-01-18 DIAGNOSIS — W260XXA Contact with knife, initial encounter: Secondary | ICD-10-CM | POA: Insufficient documentation

## 2021-01-18 DIAGNOSIS — Z23 Encounter for immunization: Secondary | ICD-10-CM | POA: Diagnosis not present

## 2021-01-18 DIAGNOSIS — Z87891 Personal history of nicotine dependence: Secondary | ICD-10-CM | POA: Diagnosis not present

## 2021-01-18 DIAGNOSIS — Y990 Civilian activity done for income or pay: Secondary | ICD-10-CM | POA: Diagnosis not present

## 2021-01-18 DIAGNOSIS — S61215A Laceration without foreign body of left ring finger without damage to nail, initial encounter: Secondary | ICD-10-CM | POA: Diagnosis not present

## 2021-01-18 MED ORDER — TETANUS-DIPHTH-ACELL PERTUSSIS 5-2.5-18.5 LF-MCG/0.5 IM SUSY
0.5000 mL | PREFILLED_SYRINGE | Freq: Once | INTRAMUSCULAR | Status: AC
  Administered 2021-01-18: 0.5 mL via INTRAMUSCULAR
  Filled 2021-01-18: qty 0.5

## 2021-01-18 NOTE — Discharge Instructions (Signed)
You had a laceration of your finger that cannot be stitched together. The finger should heal over the next few weeks. Keep the finger wrapped for 2 days and start daily dressing changes after 2 days. Keep the area clean as much as possible to avoid the finger getting infected. Return to the ED if you start having fevers chills, loss of sensation of the finger or worsening pain.

## 2021-01-18 NOTE — ED Triage Notes (Signed)
Patient here with laceration to left hand ring finger after cutting same at work with knife. Bleeding controlled

## 2021-01-18 NOTE — ED Provider Notes (Signed)
[facility] EMERGENCY DEPARTMENT Provider Note   CSN: [id number] Arrival date & time: 01/18/21  1403    History No chief complaint on file. Finger laceration  Samantha Carroll is a 25 y.o. female female with a history of anxiety and depression here with a complaint of a lacerated finger.  Patient states she works at [employer] while cutting onions earlier today at work, she sliced the tip of her left ring finger. His finger has been bleeding since the incident she has had associated pain in that finger. She denies any dizziness, numbness or tingling of the finger or any other injury. Patient states she does not know the last time she received the Tdap vaccine.    Past Medical History:  Diagnosis Date  . Anxiety   . Breast mass   . Depression    comes and goes, is good right now  . Kidney stones     Patient Active Problem List   Diagnosis Date Noted  . Alcohol use 09/27/2020  . Normal labor 08/30/2020  . SVD (spontaneous vaginal delivery) 08/30/2020  . Encounter for initial prescription of implantable subdermal contraceptive   . [redacted] weeks gestation of pregnancy 08/24/2020  . Positive GBS test 08/07/2020  . Changes in vision 05/25/2020  . Supervision of other normal pregnancy, antepartum 03/24/2020  . History of nephrolithiasis 06/17/2017  . 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 EYE SURGERY    . INDUCED ABORTION    . REFRACTIVE SURGERY     RT eye  . WISDOM TOOTH EXTRACTION       OB History    Gravida  4   Para  3   Term  3   Preterm  0   AB  1   Living  3     SAB  0   IAB  1   Ectopic  0   Multiple  0   Live Births  3        Obstetric Comments  2nd preg previa- resolved before labor        Family History  Problem Relation Age of Onset  . Brain cancer Father   . Asthma Father   . Breast cancer Cousin   . Healthy Mother     Social History   Tobacco Use  . Smoking  status: Former Smoker    Packs/day: 1.00    Years: 10.00    Pack years: 10.00    Types: Cigarettes    Quit date: 01/14/2020    Years since quitting: 1.0  . Smokeless tobacco: Never Used  Vaping Use  . Vaping Use: Former  Substance Use Topics  . Alcohol use: Not Currently    Alcohol/week: 7.0 standard drinks    Types: 7 Shots of liquor per week    Comment: wkly  . Drug use: No    Home Medications Prior to Admission medications   Medication Sig Start Date End Date Taking? Authorizing Provider  acetaminophen (TYLENOL) 325 MG tablet Take 2 tablets (650 mg total) by mouth every 6 (six) hours as needed for mild pain, moderate pain, fever or headache. 08/31/20   Sheila Oats, MD  cetirizine (ZYRTEC ALLERGY) 10 MG tablet Take 1 tablet (10 mg total) by mouth daily. 07/20/20   Burleson, Brand Males, NP  fluticasone (FLONASE) 50 MCG/ACT nasal spray Place 1 spray into both nostrils daily. 07/20/20   Burleson, Brand Males, NP  ibuprofen (ADVIL) 600 MG  tablet Take 1 tablet (600 mg total) by mouth every 6 (six) hours. 08/31/20   Sheila Oats, MD  Prenat w/o A-FeCbn-Meth-FA-DHA (PRENATE MINI) 29-0.6-0.4-350 MG CAPS Take 1 capsule by mouth daily before breakfast. Patient not taking: No sig reported 03/30/20   Brock Bad, MD  sertraline (ZOLOFT) 100 MG tablet Take 1 tablet (100 mg total) by mouth daily. 12/22/20   Shirlean Mylar, MD    Allergies    Pineapple  Review of Systems   Review of Systems  Eyes: Negative for photophobia.  Respiratory: Negative for shortness of breath.   Cardiovascular: Negative for palpitations.  Musculoskeletal: Negative for back pain.       Left ring finger pain  Neurological: Negative for dizziness, numbness and headaches.  Hematological: Does not bruise/bleed easily.  Psychiatric/Behavioral: Positive for self-injury.    Physical Exam Updated Vital Signs BP 130/86 (BP Location: Right Arm)   Pulse 72   Temp 98.8 F (37.1 C)   Resp 16   SpO2 100%    Physical Exam Eyes:     Extraocular Movements: Extraocular movements intact.     Conjunctiva/sclera: Conjunctivae normal.  Cardiovascular:     Rate and Rhythm: Normal rate and regular rhythm.     Pulses: Normal pulses.     Heart sounds: Normal heart sounds.  Pulmonary:     Effort: Pulmonary effort is normal.  Abdominal:     General: Abdomen is flat. Bowel sounds are normal.     Palpations: Abdomen is soft.  Musculoskeletal:        General: Normal range of motion.       Arms:     Cervical back: Normal range of motion and neck supple.  Skin:    General: Skin is warm and dry.  Neurological:     General: No focal deficit present.     Mental Status: She is oriented to person, place, and time.     ED Results / Procedures / Treatments   Labs (all labs ordered are listed, but only abnormal results are displayed) Labs Reviewed - No data to display  EKG None  Radiology No results found.  Procedures Procedures   Medications Ordered in ED Medications  Tdap (BOOSTRIX) injection 0.5 mL (has no administration in time range)    ED Course  I have reviewed the triage vital signs and the nursing notes.  Pertinent labs & imaging results that were available during my care of the patient were reviewed by me and considered in my medical decision making (see chart for details).    MDM Rules/Calculators/A&P                          25 year old female with no significant medical problems here for evaluation of left ring finger laceration. Patient cut her finger while at work today. She endorse mild finger pain but finger is neurologically intact distally on exam.  Tip of finger looks like it was shaved off with the cut. Stitches to this area would not be possible.  Bleeding was controlled with gauze. Patient was given a Tdap booster.  Patient discharged home with instructions to keep finger wrapped for 2 days and change dressing daily after that. Return precautions given.  Final  Clinical Impression(s) / ED Diagnoses Final diagnoses:  Laceration of left ring finger without foreign body without damage to nail, initial encounter    Rx / DC Orders ED Discharge Orders    None  Steffanie Rainwater, MD 01/18/21 Herminio Commons    Pricilla Loveless, MD 01/18/21 [phone]

## 2021-01-18 NOTE — ED Provider Notes (Signed)
Emergency Medicine Provider Triage Evaluation Note  Samantha Carroll , a 25 y.o. female  was evaluated in triage.  Pt complains of laceration.  Review of Systems  Positive: Laceration to left middle finger Negative: fever  Physical Exam  BP 130/86 (BP Location: Right Arm)   Pulse 72   Temp 98.8 F (37.1 C)   Resp 16   SpO2 100%  Gen:   Awake, no distress   Resp:  Normal effort  MSK:   Moves extremities without difficulty  Other:  Laceration noted to the left long finger  Medical Decision Making  Medically screening exam initiated at 2:35 PM.  Appropriate orders placed.  VAUDA SALVUCCI was informed that the remainder of the evaluation will be completed by another provider, this initial triage assessment does not replace that evaluation, and the importance of remaining in the ED until their evaluation is complete.     Rayne Du 01/18/21 1525    Jacalyn Lefevre, MD 01/19/21 1718

## 2021-11-04 ENCOUNTER — Encounter (HOSPITAL_COMMUNITY): Payer: Self-pay | Admitting: *Deleted

## 2021-11-04 ENCOUNTER — Other Ambulatory Visit: Payer: Self-pay

## 2021-11-04 ENCOUNTER — Emergency Department (HOSPITAL_COMMUNITY)
Admission: EM | Admit: 2021-11-04 | Discharge: 2021-11-04 | Disposition: A | Discharge status: Home / Self Care | Payer: Medicaid Other | Attending: Emergency Medicine | Admitting: Emergency Medicine

## 2021-11-04 DIAGNOSIS — N898 Other specified noninflammatory disorders of vagina: Secondary | ICD-10-CM | POA: Insufficient documentation

## 2021-11-04 DIAGNOSIS — N949 Unspecified condition associated with female genital organs and menstrual cycle: Secondary | ICD-10-CM

## 2021-11-04 DIAGNOSIS — Z79899 Other long term (current) drug therapy: Secondary | ICD-10-CM | POA: Insufficient documentation

## 2021-11-04 DIAGNOSIS — N9089 Other specified noninflammatory disorders of vulva and perineum: Secondary | ICD-10-CM

## 2021-11-04 LAB — HIV ANTIBODY (ROUTINE TESTING W REFLEX): HIV Screen 4th Generation wRfx: NONREACTIVE

## 2021-11-04 LAB — WET PREP, GENITAL
Clue Cells Wet Prep HPF POC: NONE SEEN
Sperm: NONE SEEN
Trich, Wet Prep: NONE SEEN
WBC, Wet Prep HPF POC: >=10 — AB (ref <10)
Yeast Wet Prep HPF POC: NONE SEEN

## 2021-11-04 LAB — URINALYSIS, ROUTINE W REFLEX MICROSCOPIC
Bilirubin Urine: NEGATIVE
Glucose, UA: NEGATIVE mg/dL
Hgb urine dipstick: NEGATIVE
Ketones, ur: 5 mg/dL — AB
Leukocytes,Ua: NEGATIVE
Nitrite: NEGATIVE
Protein, ur: NEGATIVE mg/dL
Specific Gravity, Urine: 1.026 (ref 1.005–1.030)
pH: 6 (ref 5.0–8.0)

## 2021-11-04 MED ORDER — ACYCLOVIR 800 MG PO TABS: 800.0000 mg | ORAL_TABLET | Freq: Two times a day (BID) | ORAL | 0 refills | Status: AC

## 2021-11-04 NOTE — Discharge Instructions (Addendum)
Please follow-up with OB/GYN regarding your symptoms within the next 2 to 4 days.  You have been provided with a medication that may help with your symptoms.  Please take this for the full 5 days or until you are able to follow-up with OB/GYN. ? ?Return to the ED for new or worsening symptoms as discussed ?

## 2021-11-04 NOTE — ED Provider Notes (Signed)
[facility] ?Provider Note ? ? ?CSN: [id number] ?Arrival date & time: 11/04/21  0913 ? ?  ? ?History ? ?Chief Complaint  ?Patient presents with  ? Vaginitis  ? ? ?[name] is a 26 y.o. female presenting today with 2 days of vaginal spasms and bumps on the outer area.  Recently engaged in unprotected intercourse about a week ago.  She is currently on her menstrual cycle, finishing today or tomorrow.  Has never experienced spasms during her menses before.  Spasms last for few seconds and occur at random.  Denies vaginal pain, abdominal pain, nausea, vomiting, fever, or pelvic pain.  Denies urinary symptoms.  Patient also noticed a couple small bumps appearing on the outer area of her vagina.  Does not note them to be extremely painful.  Has not noticed any discharge from them.  Has had ingrown hairs before, but states these look and feel different.  History of trichomonal and chlamydial infections. ? ?The history is provided by the patient and medical records.  ? ?  ? ?Home Medications ?Prior to Admission medications   ?Medication Sig Start Date End Date Taking? Authorizing Provider  ?acyclovir (ZOVIRAX) 800 MG tablet Take 1 tablet (800 mg total) by mouth 2 (two) times daily for 5 days. 11/04/21 11/09/21 Yes Cecil Cobbsockerham, Anzel Kearse M, PA-C  ?acetaminophen (TYLENOL) 325 MG tablet Take 2 tablets (650 mg total) by mouth every 6 (six) hours as needed for mild pain, moderate pain, fever or headache. 08/31/20   Sheila OatsGoswick, Anna E, MD  ?cetirizine (ZYRTEC ALLERGY) 10 MG tablet Take 1 tablet (10 mg total) by mouth daily. 07/20/20   Burleson, Brand Maleserri L, NP  ?fluticasone (FLONASE) 50 MCG/ACT nasal spray Place 1 spray into both nostrils daily. 07/20/20   Burleson, Brand Maleserri L, NP  ?ibuprofen (ADVIL) 600 MG tablet Take 1 tablet (600 mg total) by mouth every 6 (six) hours. 08/31/20   Sheila OatsGoswick, Anna E, MD  ?Prenat w/o A-FeCbn-Meth-FA-DHA (PRENATE MINI) 29-0.6-0.4-350 MG CAPS Take 1 capsule by mouth daily  before breakfast. ?Patient not taking: No sig reported 03/30/20   Brock BadHarper, Charles A, MD  ?sertraline (ZOLOFT) 100 MG tablet Take 1 tablet (100 mg total) by mouth daily. 12/22/20   Shirlean MylarMahoney, Caitlin, MD  ?   ? ?Allergies    ?Pineapple   ? ?Review of Systems   ?Review of Systems  ?Genitourinary:  Positive for genital sores.  ?     Vaginal spasms  ? ?Physical Exam ?Updated Vital Signs ?BP 127/85 (BP Location: Right Arm)   Pulse 77   Temp 98.4 ?F (36.9 ?C) (Oral)   Resp 16   Ht 5\' 8"  (1.727 m)   Wt 81.6 kg   LMP 10/30/2021   SpO2 100%   BMI 27.37 kg/m?  ?Physical Exam ?Vitals and nursing note reviewed. Exam conducted with a chaperone present.  ?Constitutional:   ?   General: She is not in acute distress. ?   Appearance: Normal appearance. She is well-developed. She is obese. She is not ill-appearing or diaphoretic.  ?HENT:  ?   Head: Normocephalic and atraumatic.  ?Eyes:  ?   Conjunctiva/sclera: Conjunctivae normal.  ?Cardiovascular:  ?   Rate and Rhythm: Normal rate and regular rhythm.  ?Pulmonary:  ?   Effort: Pulmonary effort is normal. No respiratory distress.  ?Abdominal:  ?   Palpations: Abdomen is soft.  ?   Tenderness: There is no abdominal tenderness.  ?Genitourinary: ?   Exam position: Lithotomy position.  ?  Vagina: Vaginal discharge Manson Passey) present.  ?   Cervix: No cervical motion tenderness, discharge, lesion, erythema or cervical bleeding.  ? ? ?   Comments: Raised lesions as indicated above.  One of the lesions on the left labia minora appeared to have possible white/clear discharge ? ?Vaginal canal and cervix negative for lesions, bright red blood, foreign body, prolapse, or tenderness ?Musculoskeletal:     ?   General: No swelling.  ?   Cervical back: Neck supple.  ?Skin: ?   General: Skin is warm and dry.  ?   Capillary Refill: Capillary refill takes less than 2 seconds.  ?Neurological:  ?   Mental Status: She is alert and oriented to person, place, and time.  ?Psychiatric:     ?   Mood and  Affect: Mood normal.  ? ? ?ED Results / Procedures / Treatments   ?Labs ?(all labs ordered are listed, but only abnormal results are displayed) ?Labs Reviewed  ?WET PREP, GENITAL - Abnormal; Notable for the following components:  ?    Result Value  ? WBC, Wet Prep HPF POC >=10 (*)   ? All other components within normal limits  ?URINALYSIS, ROUTINE W REFLEX MICROSCOPIC - Abnormal; Notable for the following components:  ? APPearance HAZY (*)   ? Ketones, ur 5 (*)   ? All other components within normal limits  ?HSV CULTURE AND TYPING  ?HIV ANTIBODY (ROUTINE TESTING W REFLEX)  ?RPR  ?GC/CHLAMYDIA PROBE AMP ([place]) NOT AT [facility]  ? ? ?EKG ?None ? ?Radiology ?No results found. ? ?Procedures ?Procedures  ? ? ?Medications Ordered in ED ?Medications - No data to display ? ?ED Course/ Medical Decision Making/ A&P ?  ?                        ?Medical Decision Making ?Amount and/or Complexity of Data Reviewed ?Labs: ordered. ? ?Risk ?Prescription drug management. ? ? ?26 y.o. female presents to the ED for concern of vaginal spasms lesions.  This involves an extensive number of treatment options, and is a complaint that carries with it a high risk of complications and morbidity.  The differential diagnosis includes vaginismus, pelvic floor dysfunction, herpes simplex virus, folliculitis, condylomata acuminata ? ?Comorbidities that complicate the patient evaluation include previous STD exposures, alcohol use, depression, anxiety ? ?Additional history obtained from internal/external records available via epic ? ?Interpretation: ?I ordered, and personally interpreted labs.  The pertinent results include: Unremarkable urinalysis.  HIV negative.  Wet prep showed some elevated white blood cells, but negative for yeast, trichomoniasis, bacterial vaginosis clue cells.  HSV and RPR still pending. ? ?Intervention/ED Course: ?Patient anxious appearing on presentation.  Concerned of "vaginal spasms".  Currently on her menstrual cycle.   Notes that they were occurring only during her menstrual cycle.  Lasted a few seconds at a time and were completely random in occurrence.  Reassured this is not around intercourse and is not accompanied with pain.  Likely sounds benign vaginismus or pelvic floor dysfunction.  Previous vaginal deliveries.  Recommended follow-up with OB/GYN to further evaluate this.  In addition, several mildly tender lesions noted on physical exam in the labial regions bilaterally.  Suspicious for herpes simplex, condylomata acuminata, or folliculitis.  This did not appear to be pus filled.  Catheter labs and swab cellulitis, and sent them off for testing.  Provided patient with an antiviral in the likelihood that it is herpes simplex virus.  Patient denies ever having  this presentation before.  Recently had unprotected sex in the last few weeks.  Did not notice lesions on her partner.  Again recommended follow-up with OB/GYN and/or primary care to further evaluate this.  ? ?Social Determinants of Health include on Medicaid ? ?Disposition: ?I discussed the patient and their case with my attending, Dr. Particia Nearing, who agreed with the proposed treatment course.  After consideration of the diagnostic results and the patient's response to treatment, I feel that the patent would benefit from outpatient follow-up with primary care/OB/GYN for continued care and medical management and an outpatient treatment with antiviral.  Discussed course of treatment thoroughly with the patient and she demonstrated understanding.  Patient in agreement and has no further questions. ? ? ? ? ? ? ? ?Final Clinical Impression(s) / ED Diagnoses ?Final diagnoses:  ?Vaginal symptom  ?Labial lesion  ? ? ?Rx / DC Orders ?ED Discharge Orders   ? ?      Ordered  ?  acyclovir (ZOVIRAX) 800 MG tablet  2 times daily       ? 11/04/21 1109  ? ?  ?  ? ?  ? ? ?  ?Cecil Cobbs, PA-C ?11/04/21 1727 ? ?  ?Jacalyn Lefevre, MD ?11/05/21 [phone] ? ?

## 2021-11-04 NOTE — ED Triage Notes (Signed)
Pt states Thursday she noticed bumps on her labia.  States last weekend she had unprotected sex with her partner of 6 months.  Denies vaginal discharge or pain.  States also switched her vaginal cleaning soap approx 10 days ago. ?

## 2021-11-04 NOTE — ED Notes (Signed)
Discharge instructions, follow up care, and prescription reviewed and explained, pt verbalized understanding.  

## 2021-11-05 LAB — RPR: RPR Ser Ql: NONREACTIVE

## 2021-11-06 LAB — GC/CHLAMYDIA PROBE AMP (~~LOC~~) NOT AT ARMC
Chlamydia: NEGATIVE
Comment: NEGATIVE
Comment: NORMAL
Neisseria Gonorrhea: NEGATIVE

## 2021-11-07 LAB — HSV CULTURE AND TYPING

## 2022-06-22 LAB — GC/CHLAMYDIA PROBE AMP
Chlamydia trachomatis, NAA: NEGATIVE
Neisseria Gonorrhoeae by PCR: POSITIVE — AB
